# Patient Record
Sex: Female | Born: 1940 | Race: White | Hispanic: No | Marital: Married | Smoking: Current every day smoker
Health system: Southern US, Community
[De-identification: ages and names within clinical notes are randomized; demographics above are authoritative.]

## PROBLEM LIST (undated history)

## (undated) DIAGNOSIS — I10 Essential (primary) hypertension: Secondary | ICD-10-CM

## (undated) DIAGNOSIS — E7800 Pure hypercholesterolemia, unspecified: Secondary | ICD-10-CM

## (undated) DIAGNOSIS — M549 Dorsalgia, unspecified: Secondary | ICD-10-CM

## (undated) HISTORY — PX: ABDOMINAL HYSTERECTOMY: SHX81

## (undated) HISTORY — PX: TUMOR REMOVAL: SHX12

---

## 2004-10-19 ENCOUNTER — Ambulatory Visit: Payer: Self-pay | Admitting: Unknown Physician Specialty

## 2005-01-05 ENCOUNTER — Ambulatory Visit: Payer: Self-pay | Admitting: Internal Medicine

## 2006-01-08 ENCOUNTER — Ambulatory Visit: Payer: Self-pay | Admitting: Internal Medicine

## 2007-02-05 ENCOUNTER — Ambulatory Visit: Payer: Self-pay | Admitting: Internal Medicine

## 2008-02-06 ENCOUNTER — Ambulatory Visit: Payer: Self-pay | Admitting: Internal Medicine

## 2009-02-11 ENCOUNTER — Ambulatory Visit: Payer: Self-pay | Admitting: Internal Medicine

## 2010-04-08 ENCOUNTER — Ambulatory Visit: Payer: Self-pay | Admitting: Internal Medicine

## 2011-04-11 ENCOUNTER — Ambulatory Visit: Payer: Self-pay | Admitting: Internal Medicine

## 2012-03-15 ENCOUNTER — Ambulatory Visit: Payer: Self-pay | Admitting: Internal Medicine

## 2012-05-17 ENCOUNTER — Ambulatory Visit: Payer: Self-pay | Admitting: Internal Medicine

## 2012-10-08 ENCOUNTER — Ambulatory Visit: Payer: Self-pay | Admitting: Internal Medicine

## 2012-10-17 ENCOUNTER — Ambulatory Visit: Payer: Self-pay | Admitting: Internal Medicine

## 2013-02-20 ENCOUNTER — Ambulatory Visit: Payer: Self-pay | Admitting: Internal Medicine

## 2013-05-20 ENCOUNTER — Ambulatory Visit: Payer: Self-pay | Admitting: Internal Medicine

## 2013-07-03 ENCOUNTER — Ambulatory Visit: Payer: Self-pay | Admitting: Internal Medicine

## 2014-05-26 ENCOUNTER — Ambulatory Visit
Admit: 2014-05-26 | Disposition: A | Discharge status: Home / Self Care | Payer: Self-pay | Attending: Internal Medicine | Admitting: Internal Medicine

## 2014-08-05 ENCOUNTER — Ambulatory Visit
Admission: RE | Admit: 2014-08-05 | Discharge: 2014-08-05 | Disposition: A | Discharge status: Home / Self Care | Payer: Medicare Other | Source: Ambulatory Visit | Attending: Internal Medicine | Admitting: Internal Medicine

## 2014-08-05 ENCOUNTER — Other Ambulatory Visit: Payer: Self-pay | Admitting: Internal Medicine

## 2014-08-05 DIAGNOSIS — M549 Dorsalgia, unspecified: Secondary | ICD-10-CM | POA: Diagnosis not present

## 2016-05-31 ENCOUNTER — Ambulatory Visit: Payer: Medicare Other

## 2016-05-31 ENCOUNTER — Other Ambulatory Visit: Payer: Self-pay | Admitting: Unknown Physician Specialty

## 2016-05-31 DIAGNOSIS — R911 Solitary pulmonary nodule: Secondary | ICD-10-CM

## 2016-06-02 ENCOUNTER — Telehealth: Payer: Self-pay | Admitting: *Deleted

## 2016-06-02 DIAGNOSIS — Z87891 Personal history of nicotine dependence: Secondary | ICD-10-CM

## 2016-06-02 NOTE — Telephone Encounter (Signed)
Received referral for initial lung cancer screening scan. Contacted patient and obtained smoking history,(current, 37.5 pack year) as well as answering questions related to screening process. Patient denies signs of lung cancer such as weight loss or hemoptysis. Patient denies comorbidity that would prevent curative treatment if lung cancer were found. Patient is tentatively scheduled for shared decision making visit and CT scan on 06/20/16, pending insurance approval from business office.

## 2016-06-20 ENCOUNTER — Inpatient Hospital Stay: Payer: Medicare Other | Attending: Oncology | Admitting: Oncology

## 2016-06-20 ENCOUNTER — Ambulatory Visit
Admission: RE | Admit: 2016-06-20 | Discharge: 2016-06-20 | Disposition: A | Discharge status: Home / Self Care | Payer: Medicare Other | Source: Ambulatory Visit | Attending: Oncology | Admitting: Oncology

## 2016-06-20 ENCOUNTER — Encounter: Payer: Self-pay | Admitting: Oncology

## 2016-06-20 DIAGNOSIS — I2510 Atherosclerotic heart disease of native coronary artery without angina pectoris: Secondary | ICD-10-CM | POA: Diagnosis not present

## 2016-06-20 DIAGNOSIS — Z87891 Personal history of nicotine dependence: Secondary | ICD-10-CM | POA: Insufficient documentation

## 2016-06-20 DIAGNOSIS — Z122 Encounter for screening for malignant neoplasm of respiratory organs: Secondary | ICD-10-CM

## 2016-06-20 DIAGNOSIS — I700 Atherosclerosis of aorta: Secondary | ICD-10-CM | POA: Insufficient documentation

## 2016-06-20 DIAGNOSIS — K760 Fatty (change of) liver, not elsewhere classified: Secondary | ICD-10-CM | POA: Diagnosis not present

## 2016-06-20 DIAGNOSIS — F17210 Nicotine dependence, cigarettes, uncomplicated: Secondary | ICD-10-CM

## 2016-06-20 NOTE — Progress Notes (Signed)
In accordance with CMS guidelines, patient has met eligibility criteria including age, absence of signs or symptoms of lung cancer.  Social History  Substance Use Topics  . Smoking status: Current Every Day Smoker    Packs/day: 0.75    Years: 50.00  . Smokeless tobacco: Not on file  . Alcohol use Not on file     A shared decision-making session was conducted prior to the performance of CT scan. This includes one or more decision aids, includes benefits and harms of screening, follow-up diagnostic testing, over-diagnosis, false positive rate, and total radiation exposure.  Counseling on the importance of adherence to annual lung cancer LDCT screening, impact of co-morbidities, and ability or willingness to undergo diagnosis and treatment is imperative for compliance of the program.  Counseling on the importance of continued smoking cessation for former smokers; the importance of smoking cessation for current smokers, and information about tobacco cessation interventions have been given to patient including [place] and [facility] quit [place] programs.  Written order for lung cancer screening with LDCT has been given to the patient and any and all questions have been answered to the best of my abilities.   Yearly follow up will be coordinated by Burgess Estelle, Thoracic Navigator.

## 2016-06-22 ENCOUNTER — Encounter: Payer: Self-pay | Admitting: *Deleted

## 2017-04-08 ENCOUNTER — Other Ambulatory Visit: Payer: Self-pay

## 2017-04-08 ENCOUNTER — Ambulatory Visit
Admission: EM | Admit: 2017-04-08 | Discharge: 2017-04-08 | Disposition: A | Discharge status: Home / Self Care | Payer: Medicare Other | Attending: Family Medicine | Admitting: Family Medicine

## 2017-04-08 DIAGNOSIS — M549 Dorsalgia, unspecified: Secondary | ICD-10-CM

## 2017-04-08 DIAGNOSIS — G8929 Other chronic pain: Secondary | ICD-10-CM

## 2017-04-08 HISTORY — DX: Pure hypercholesterolemia, unspecified: E78.00

## 2017-04-08 HISTORY — DX: Dorsalgia, unspecified: M54.9

## 2017-04-08 HISTORY — DX: Essential (primary) hypertension: I10

## 2017-04-08 MED ORDER — OXYCODONE-ACETAMINOPHEN 5-325 MG PO TABS: ORAL_TABLET | ORAL | 0 refills | Status: DC

## 2017-04-08 NOTE — Discharge Instructions (Signed)
Follow up with Primary Care Provider tomorrow

## 2017-04-08 NOTE — ED Provider Notes (Signed)
[facility]    CSN: [id number] Arrival date & time: 04/08/17  0945     History   Chief Complaint Chief Complaint  Patient presents with  . Back Pain    HPI Shelley Thomas is a 77 y.o. female.   The history is provided by the patient.  Back Pain  Location:  Lumbar spine Quality:  Aching Pain severity:  Severe Pain is:  Same all the time Duration:  1 week Timing:  Constant Progression:  Unchanged Chronicity:  Chronic Context: not emotional stress, not falling, not jumping from heights, not lifting heavy objects, not MCA, not MVA, not occupational injury, not pedestrian accident, not physical stress, not recent illness, not recent injury and not twisting   Ineffective treatments:  Narcotics and OTC medications Associated symptoms: no abdominal pain, no abdominal swelling, no bladder incontinence, no bowel incontinence, no chest pain, no dysuria, no fever, no headaches, no leg pain, no numbness, no paresthesias, no pelvic pain, no perianal numbness, no tingling, no weakness and no weight loss     Past Medical History:  Diagnosis Date  . Back pain   . High cholesterol   . Hypertension     Patient Active Problem List   Diagnosis Date Noted  . Personal history of tobacco use, presenting hazards to health 06/20/2016    Past Surgical History:  Procedure Laterality Date  . ABDOMINAL HYSTERECTOMY    . TUMOR REMOVAL      OB History    No data available       Home Medications    Prior to Admission medications   Medication Sig Start Date End Date Taking? Authorizing Provider  fenofibrate (TRICOR) 145 MG tablet Take 145 mg by mouth daily.   Yes [provider]  traMADol (ULTRAM) 50 MG tablet Take by mouth every 6 (six) hours as needed.   Yes [provider]  oxyCODONE-acetaminophen (PERCOCET/ROXICET) 5-325 MG tablet 1-2 tabs po q 8 hours prn 04/08/17   Payton Mccallum, MD    Family History History reviewed. No pertinent family  history.  Social History Social History   Tobacco Use  . Smoking status: Current Every Day Smoker    Packs/day: 0.50    Years: 50.00    Pack years: 25.00    Types: Cigarettes  . Smokeless tobacco: Never Used  Substance Use Topics  . Alcohol use: No    Frequency: Never  . Drug use: No     Allergies   Patient has no known allergies.   Review of Systems Review of Systems  Constitutional: Negative for fever and weight loss.  Cardiovascular: Negative for chest pain.  Gastrointestinal: Negative for abdominal pain and bowel incontinence.  Genitourinary: Negative for bladder incontinence, dysuria and pelvic pain.  Musculoskeletal: Positive for back pain.  Neurological: Negative for tingling, weakness, numbness, headaches and paresthesias.     Physical Exam Triage Vital Signs ED Triage Vitals  Enc Vitals Group     BP 04/08/17 1100 (!) 163/96     Pulse Rate 04/08/17 1100 93     Resp 04/08/17 1100 20     Temp 04/08/17 1100 98.1 F (36.7 C)     Temp Source 04/08/17 1100 Oral     SpO2 04/08/17 1100 95 %     Weight 04/08/17 1101 126 lb (57.2 kg)     Height 04/08/17 1101 5\' 2"  (1.575 m)     Head Circumference --      Peak Flow --  Pain Score 04/08/17 1100 8     Pain Loc --      Pain Edu? --      Excl. in GC? --    No data found.  Updated Vital Signs BP (!) 163/96 (BP Location: Left Arm)   Pulse 93   Temp 98.1 F (36.7 C) (Oral)   Resp 20   Ht 5\' 2"  (1.575 m)   Wt 126 lb (57.2 kg)   SpO2 95%   BMI 23.05 kg/m   Visual Acuity Right Eye Distance:   Left Eye Distance:   Bilateral Distance:    Right Eye Near:   Left Eye Near:    Bilateral Near:     Physical Exam  Constitutional: She appears well-developed and well-nourished. No distress.  Musculoskeletal: She exhibits tenderness. She exhibits no edema.       Lumbar back: She exhibits tenderness and spasm. She exhibits normal range of motion, no bony tenderness, no swelling, no edema, no deformity, no  laceration, no pain and normal pulse.  Neurological: She is alert. She has normal reflexes. She displays normal reflexes. She exhibits normal muscle tone.  Skin: Skin is warm and dry. No rash noted. She is not diaphoretic. No erythema.  Nursing note and vitals reviewed.    UC Treatments / Results  Labs (all labs ordered are listed, but only abnormal results are displayed) Labs Reviewed - No data to display  EKG  EKG Interpretation None       Radiology No results found.  Procedures Procedures (including critical care time)  Medications Ordered in UC Medications - No data to display   Initial Impression / Assessment and Plan / UC Course  I have reviewed the triage vital signs and the nursing notes.  Pertinent labs & imaging results that were available during my care of the patient were reviewed by me and considered in my medical decision making (see chart for details).      Final Clinical Impressions(s) / UC Diagnoses   Final diagnoses:  Chronic right-sided back pain, unspecified back location    ED Discharge Orders        Ordered    oxyCODONE-acetaminophen (PERCOCET/ROXICET) 5-325 MG tablet     04/08/17 1203     1.  diagnosis reviewed with patient 2. rx as per orders above; reviewed possible side effects, interactions, risks and benefits  3. Recommend supportive treatment with rest, ice 4. Follow-up with PCP  Controlled Substance Prescriptions [place] Controlled Substance Registry consulted? Not Applicable   Payton Mccallumonty, Leary Mcnulty, MD 04/08/17 1215

## 2017-04-08 NOTE — ED Triage Notes (Addendum)
Pt with back pain. Seen by her Dr. This week for same. Given Tramadol by him and "It's not doing an good." Pain is chronic from an old "rumor removal". Left mid back pain. Pt would like oxy

## 2017-04-10 ENCOUNTER — Ambulatory Visit
Admission: RE | Admit: 2017-04-10 | Discharge: 2017-04-10 | Disposition: A | Discharge status: Home / Self Care | Payer: Medicare Other | Source: Ambulatory Visit | Attending: Internal Medicine | Admitting: Internal Medicine

## 2017-04-10 ENCOUNTER — Other Ambulatory Visit: Payer: Self-pay | Admitting: Internal Medicine

## 2017-04-10 DIAGNOSIS — M549 Dorsalgia, unspecified: Secondary | ICD-10-CM | POA: Insufficient documentation

## 2017-04-10 DIAGNOSIS — M47816 Spondylosis without myelopathy or radiculopathy, lumbar region: Secondary | ICD-10-CM | POA: Diagnosis not present

## 2017-04-10 DIAGNOSIS — M519 Unspecified thoracic, thoracolumbar and lumbosacral intervertebral disc disorder: Secondary | ICD-10-CM | POA: Insufficient documentation

## 2017-04-13 ENCOUNTER — Other Ambulatory Visit: Payer: Self-pay | Admitting: Internal Medicine

## 2017-04-13 DIAGNOSIS — R3121 Asymptomatic microscopic hematuria: Secondary | ICD-10-CM

## 2017-04-18 ENCOUNTER — Ambulatory Visit
Admission: RE | Admit: 2017-04-18 | Discharge: 2017-04-18 | Disposition: A | Discharge status: Home / Self Care | Payer: Medicare Other | Source: Ambulatory Visit | Attending: Internal Medicine | Admitting: Internal Medicine

## 2017-04-18 DIAGNOSIS — R109 Unspecified abdominal pain: Secondary | ICD-10-CM | POA: Diagnosis present

## 2017-04-18 DIAGNOSIS — N281 Cyst of kidney, acquired: Secondary | ICD-10-CM | POA: Insufficient documentation

## 2017-04-18 DIAGNOSIS — N200 Calculus of kidney: Secondary | ICD-10-CM | POA: Diagnosis not present

## 2017-04-18 DIAGNOSIS — N8110 Cystocele, unspecified: Secondary | ICD-10-CM | POA: Insufficient documentation

## 2017-04-18 DIAGNOSIS — R3121 Asymptomatic microscopic hematuria: Secondary | ICD-10-CM

## 2017-04-18 DIAGNOSIS — I700 Atherosclerosis of aorta: Secondary | ICD-10-CM | POA: Insufficient documentation

## 2017-05-16 ENCOUNTER — Other Ambulatory Visit: Payer: Self-pay | Admitting: Internal Medicine

## 2017-05-16 ENCOUNTER — Ambulatory Visit
Admission: RE | Admit: 2017-05-16 | Discharge: 2017-05-16 | Disposition: A | Discharge status: Home / Self Care | Payer: Medicare Other | Source: Ambulatory Visit | Attending: Internal Medicine | Admitting: Internal Medicine

## 2017-05-16 DIAGNOSIS — I700 Atherosclerosis of aorta: Secondary | ICD-10-CM | POA: Insufficient documentation

## 2017-05-16 DIAGNOSIS — R05 Cough: Secondary | ICD-10-CM | POA: Insufficient documentation

## 2017-05-16 DIAGNOSIS — R0902 Hypoxemia: Secondary | ICD-10-CM | POA: Diagnosis not present

## 2017-05-16 DIAGNOSIS — J440 Chronic obstructive pulmonary disease with acute lower respiratory infection: Secondary | ICD-10-CM | POA: Diagnosis not present

## 2017-05-16 DIAGNOSIS — J181 Lobar pneumonia, unspecified organism: Secondary | ICD-10-CM | POA: Insufficient documentation

## 2017-05-16 DIAGNOSIS — R059 Cough, unspecified: Secondary | ICD-10-CM

## 2017-05-16 DIAGNOSIS — R509 Fever, unspecified: Secondary | ICD-10-CM | POA: Diagnosis not present

## 2017-06-12 ENCOUNTER — Ambulatory Visit
Admission: RE | Admit: 2017-06-12 | Discharge: 2017-06-12 | Disposition: A | Discharge status: Home / Self Care | Payer: Medicare Other | Source: Ambulatory Visit | Attending: Internal Medicine | Admitting: Internal Medicine

## 2017-06-12 ENCOUNTER — Other Ambulatory Visit: Payer: Self-pay | Admitting: Internal Medicine

## 2017-06-12 DIAGNOSIS — Z8701 Personal history of pneumonia (recurrent): Secondary | ICD-10-CM | POA: Diagnosis not present

## 2017-06-12 DIAGNOSIS — I700 Atherosclerosis of aorta: Secondary | ICD-10-CM | POA: Diagnosis not present

## 2017-06-12 DIAGNOSIS — Z09 Encounter for follow-up examination after completed treatment for conditions other than malignant neoplasm: Secondary | ICD-10-CM | POA: Diagnosis not present

## 2017-06-12 DIAGNOSIS — J440 Chronic obstructive pulmonary disease with acute lower respiratory infection: Secondary | ICD-10-CM | POA: Diagnosis not present

## 2017-06-12 DIAGNOSIS — J189 Pneumonia, unspecified organism: Secondary | ICD-10-CM

## 2017-06-18 ENCOUNTER — Telehealth: Payer: Self-pay | Admitting: *Deleted

## 2017-06-18 DIAGNOSIS — Z87891 Personal history of nicotine dependence: Secondary | ICD-10-CM

## 2017-06-18 DIAGNOSIS — Z122 Encounter for screening for malignant neoplasm of respiratory organs: Secondary | ICD-10-CM

## 2017-06-18 NOTE — Telephone Encounter (Signed)
Notified patient that annual lung cancer screening low dose CT scan is due currently or will be in near future. Confirmed that patient is within the age range of 55-77, and asymptomatic, (no signs or symptoms of lung cancer). Patient denies illness that would prevent curative treatment for lung cancer if found. Verified smoking history, (former, quit 04/06/17, 37.5 pack year). The shared decision making visit was done 06/20/16. Patient is agreeable for CT scan being scheduled.

## 2017-06-27 ENCOUNTER — Ambulatory Visit: Payer: Medicare Other

## 2017-06-29 ENCOUNTER — Ambulatory Visit
Admission: RE | Admit: 2017-06-29 | Discharge: 2017-06-29 | Disposition: A | Discharge status: Home / Self Care | Payer: Medicare Other | Source: Ambulatory Visit | Attending: Nurse Practitioner | Admitting: Nurse Practitioner

## 2017-06-29 DIAGNOSIS — I700 Atherosclerosis of aorta: Secondary | ICD-10-CM | POA: Diagnosis not present

## 2017-06-29 DIAGNOSIS — Z87891 Personal history of nicotine dependence: Secondary | ICD-10-CM | POA: Diagnosis not present

## 2017-06-29 DIAGNOSIS — Z122 Encounter for screening for malignant neoplasm of respiratory organs: Secondary | ICD-10-CM

## 2017-06-29 DIAGNOSIS — J439 Emphysema, unspecified: Secondary | ICD-10-CM | POA: Insufficient documentation

## 2017-07-06 ENCOUNTER — Telehealth: Payer: Self-pay | Admitting: *Deleted

## 2017-07-06 NOTE — Telephone Encounter (Signed)
Notified patient of LDCT lung cancer screening program results with recommendation for 3 month follow up imaging. Also notified of incidental findings noted below and is encouraged to discuss further with PCP who will receive a copy of this note and/or the CT report. Patient verbalizes understanding. Note, recent history of antibiotic treatment verified by PCP, reviewed with thoracic surgery and radiologist. Revised recommendation is for 3 month follow up CT without further antibiotic treatment.   IMPRESSION: 1. Lung-RADS 4Bs, suspicious. Additional imaging evaluation or consultation with Pulmonology or Thoracic Surgery recommended. 2. The S modifier refers to the potential inflammatory etiology of the new 13.9 mm nodule in the right base. For this reason consider treatment with antibiotic therapy and follow-up imaging in 3 months following resolution of associated respiratory symptoms the patient may have using the CT CHEST LCS FOLLOWUP protocol. 3.  Emphysema (ICD10-J43.9). 4.  Aortic Atherosclerosis (ICD10-I70.0).

## 2017-09-15 ENCOUNTER — Telehealth: Payer: Self-pay

## 2017-09-15 NOTE — Telephone Encounter (Signed)
Call pt on 09-15-17 regarding Lung Screening  Former smoker would like scan to be on Aug 26th morning if possible.

## 2017-09-17 ENCOUNTER — Telehealth: Payer: Self-pay | Admitting: *Deleted

## 2017-09-17 DIAGNOSIS — Z122 Encounter for screening for malignant neoplasm of respiratory organs: Secondary | ICD-10-CM

## 2017-09-17 DIAGNOSIS — R918 Other nonspecific abnormal finding of lung field: Secondary | ICD-10-CM

## 2017-09-17 DIAGNOSIS — Z87891 Personal history of nicotine dependence: Secondary | ICD-10-CM

## 2017-09-17 NOTE — Telephone Encounter (Signed)
Patient has been notified that her recommended lung screening follow up imaging is due soon. Patient is agreeable to having it scheduled

## 2017-10-01 ENCOUNTER — Ambulatory Visit
Admission: RE | Admit: 2017-10-01 | Discharge: 2017-10-01 | Disposition: A | Discharge status: Home / Self Care | Payer: Medicare Other | Source: Ambulatory Visit | Attending: Nurse Practitioner | Admitting: Nurse Practitioner

## 2017-10-01 DIAGNOSIS — N200 Calculus of kidney: Secondary | ICD-10-CM | POA: Insufficient documentation

## 2017-10-01 DIAGNOSIS — Z122 Encounter for screening for malignant neoplasm of respiratory organs: Secondary | ICD-10-CM | POA: Diagnosis present

## 2017-10-01 DIAGNOSIS — I700 Atherosclerosis of aorta: Secondary | ICD-10-CM | POA: Diagnosis not present

## 2017-10-01 DIAGNOSIS — Z87891 Personal history of nicotine dependence: Secondary | ICD-10-CM | POA: Diagnosis not present

## 2017-10-01 DIAGNOSIS — R918 Other nonspecific abnormal finding of lung field: Secondary | ICD-10-CM | POA: Insufficient documentation

## 2017-10-01 DIAGNOSIS — K760 Fatty (change of) liver, not elsewhere classified: Secondary | ICD-10-CM | POA: Insufficient documentation

## 2017-10-01 DIAGNOSIS — J479 Bronchiectasis, uncomplicated: Secondary | ICD-10-CM | POA: Diagnosis not present

## 2017-10-03 ENCOUNTER — Telehealth: Payer: Self-pay | Admitting: *Deleted

## 2017-10-03 NOTE — Telephone Encounter (Signed)
Notified patient of LDCT lung cancer screening program results with recommendation for 12 month follow up imaging. Also notified of incidental findings noted below and is encouraged to discuss further with PCP who will receive a copy of this note and/or the CT report. Patient verbalizes understanding.   IMPRESSION: 1. Lung-RADS 2, benign appearance or behavior. Continue annual screening with low-dose chest CT without contrast in 12 months. 2. Stable focal patchy tree-in-bud opacity/mild peribronchovascular consolidation in the basilar right upper lobe with associated mild bronchiectasis, most compatible with chronic mild bronchiolitis. 3. Diffuse hepatic steatosis. 4. Nonobstructing left nephrolithiasis.  Aortic Atherosclerosis (ICD10-I70.0).

## 2017-12-01 ENCOUNTER — Ambulatory Visit
Admission: EM | Admit: 2017-12-01 | Discharge: 2017-12-01 | Disposition: A | Discharge status: Home / Self Care | Payer: Medicare Other | Attending: Family Medicine | Admitting: Family Medicine

## 2017-12-01 ENCOUNTER — Other Ambulatory Visit: Payer: Self-pay

## 2017-12-01 ENCOUNTER — Encounter: Payer: Self-pay | Admitting: Gynecology

## 2017-12-01 DIAGNOSIS — R300 Dysuria: Secondary | ICD-10-CM

## 2017-12-01 DIAGNOSIS — I10 Essential (primary) hypertension: Secondary | ICD-10-CM | POA: Diagnosis not present

## 2017-12-01 DIAGNOSIS — R350 Frequency of micturition: Secondary | ICD-10-CM | POA: Diagnosis present

## 2017-12-01 DIAGNOSIS — B9689 Other specified bacterial agents as the cause of diseases classified elsewhere: Secondary | ICD-10-CM | POA: Diagnosis not present

## 2017-12-01 DIAGNOSIS — N3001 Acute cystitis with hematuria: Secondary | ICD-10-CM | POA: Insufficient documentation

## 2017-12-01 DIAGNOSIS — E7800 Pure hypercholesterolemia, unspecified: Secondary | ICD-10-CM | POA: Diagnosis not present

## 2017-12-01 DIAGNOSIS — F17210 Nicotine dependence, cigarettes, uncomplicated: Secondary | ICD-10-CM | POA: Diagnosis not present

## 2017-12-01 DIAGNOSIS — Z79899 Other long term (current) drug therapy: Secondary | ICD-10-CM | POA: Insufficient documentation

## 2017-12-01 LAB — URINALYSIS, COMPLETE (UACMP) WITH MICROSCOPIC
BILIRUBIN URINE: NEGATIVE
Glucose, UA: NEGATIVE mg/dL
Ketones, ur: NEGATIVE mg/dL
NITRITE: NEGATIVE
PH: 5 (ref 5.0–8.0)
Protein, ur: 100 mg/dL — AB
Specific Gravity, Urine: 1.025 (ref 1.005–1.030)

## 2017-12-01 MED ORDER — CEPHALEXIN 500 MG PO CAPS: 500.0000 mg | ORAL_CAPSULE | Freq: Two times a day (BID) | ORAL | 0 refills | Status: AC

## 2017-12-01 NOTE — ED Provider Notes (Signed)
[facility]    CSN: [id number] Arrival date & time: 12/01/17  1530     History   Chief Complaint No chief complaint on file.   HPI Shelley Thomas is a 77 y.o. female.   Presents to the urgent care facility for evaluation of urinary symptoms x2 days.  Patient states 2 days ago she developed increase in urinary frequency and pressure with urination.  Patient states when she urinates she feels burning.  She denies any back pain, fevers, nausea vomiting or abdominal pain.  Patient has seen some improvement today with her symptoms except for increase in urinary frequency.  She denies having any other symptoms or feeling bad.  No history of recent UTIs.  No vaginal discharge or bleeding.  HPI  Past Medical History:  Diagnosis Date  . Back pain   . High cholesterol   . Hypertension     Patient Active Problem List   Diagnosis Date Noted  . Personal history of tobacco use, presenting hazards to health 06/20/2016    Past Surgical History:  Procedure Laterality Date  . ABDOMINAL HYSTERECTOMY    . TUMOR REMOVAL      OB History   None      Home Medications    Prior to Admission medications   Medication Sig Start Date End Date Taking? Authorizing Provider  fenofibrate (TRICOR) 145 MG tablet Take 145 mg by mouth daily.   Yes [provider]  lisinopril (PRINIVIL,ZESTRIL) 20 MG tablet Take 20 mg by mouth 2 (two) times daily. 11/07/17  Yes [provider]  cephALEXin (KEFLEX) 500 MG capsule Take 1 capsule (500 mg total) by mouth 2 (two) times daily for 7 days. 12/01/17 12/08/17  Evon Slack, PA-C  oxyCODONE-acetaminophen (PERCOCET/ROXICET) 5-325 MG tablet 1-2 tabs po q 8 hours prn 04/08/17   Payton Mccallum, MD  traMADol (ULTRAM) 50 MG tablet Take by mouth every 6 (six) hours as needed.    [provider]    Family History History reviewed. No pertinent family history.  Social History Social History   Tobacco Use  . Smoking  status: Current Every Day Smoker    Packs/day: 0.50    Years: 50.00    Pack years: 25.00    Types: Cigarettes  . Smokeless tobacco: Never Used  Substance Use Topics  . Alcohol use: No    Frequency: Never  . Drug use: No     Allergies   Patient has no known allergies.   Review of Systems Review of Systems  Constitutional: Negative for fever.  Gastrointestinal: Negative for nausea and vomiting.  Genitourinary: Positive for dysuria, frequency and urgency. Negative for difficulty urinating, vaginal bleeding and vaginal discharge.  Musculoskeletal: Negative for back pain.  Skin: Negative for rash and wound.     Physical Exam Triage Vital Signs ED Triage Vitals  Enc Vitals Group     BP 12/01/17 1542 (!) 120/97     Pulse Rate 12/01/17 1542 (!) 105     Resp 12/01/17 1542 16     Temp 12/01/17 1542 98.6 F (37 C)     Temp Source 12/01/17 1542 Oral     SpO2 12/01/17 1542 96 %     Weight 12/01/17 1544 125 lb (56.7 kg)     Height 12/01/17 1544 5\' 2"  (1.575 m)     Head Circumference --      Peak Flow --      Pain Score 12/01/17 1543 0     Pain  Loc --      Pain Edu? --      Excl. in GC? --    No data found.  Updated Vital Signs BP (!) 120/97 (BP Location: Left Arm)   Pulse (!) 105   Temp 98.6 F (37 C) (Oral)   Resp 16   Ht 5\' 2"  (1.575 m)   Wt 125 lb (56.7 kg)   SpO2 96%   BMI 22.86 kg/m   Visual Acuity Right Eye Distance:   Left Eye Distance:   Bilateral Distance:    Right Eye Near:   Left Eye Near:    Bilateral Near:     Physical Exam  Constitutional: She is oriented to person, place, and time. She appears well-developed and well-nourished.  HENT:  Head: Normocephalic and atraumatic.  Eyes: Conjunctivae are normal.  Neck: Normal range of motion.  Cardiovascular: Normal rate.  Pulmonary/Chest: Effort normal. No respiratory distress.  Abdominal: Soft. She exhibits no distension. There is no tenderness. There is no guarding.  No CVA tenderness to  percussion bilaterally.  Musculoskeletal: Normal range of motion.  Neurological: She is alert and oriented to person, place, and time.  Skin: Skin is warm. No rash noted.  Psychiatric: She has a normal mood and affect. Her behavior is normal. Thought content normal.     UC Treatments / Results  Labs (all labs ordered are listed, but only abnormal results are displayed) Labs Reviewed  URINALYSIS, COMPLETE (UACMP) WITH MICROSCOPIC - Abnormal; Notable for the following components:      Result Value   APPearance CLOUDY (*)    Hgb urine dipstick MODERATE (*)    Protein, ur 100 (*)    Leukocytes, UA MODERATE (*)    Bacteria, UA FEW (*)    All other components within normal limits  URINE CULTURE    EKG None  Radiology No results found.  Procedures Procedures (including critical care time)  Medications Ordered in UC Medications - No data to display  Initial Impression / Assessment and Plan / UC Course  I have reviewed the triage vital signs and the nursing notes.  Pertinent labs & imaging results that were available during my care of the patient were reviewed by me and considered in my medical decision making (see chart for details).     77 year old female with acute UTI with hematuria.  She is started on cephalexin.  Urine culture obtained.  She understands signs and symptoms return to clinic for. Final Clinical Impressions(s) / UC Diagnoses   Final diagnoses:  Dysuria  Acute cystitis with hematuria     Discharge Instructions     Please make sure you continue to drink lots of fluids.  If any fevers back pain nausea or vomiting return to the clinic.  Make sure you take antibiotics as prescribed.    ED Prescriptions    Medication Sig Dispense Auth. Provider   cephALEXin (KEFLEX) 500 MG capsule Take 1 capsule (500 mg total) by mouth 2 (two) times daily for 7 days. 14 capsule Ronnette Juniper        Evon Slack, [place] 12/01/17 1308

## 2017-12-01 NOTE — Discharge Instructions (Addendum)
Please make sure you continue to drink lots of fluids.  If any fevers back pain nausea or vomiting return to the clinic.  Make sure you take antibiotics as prescribed.

## 2017-12-01 NOTE — ED Triage Notes (Signed)
Per patient with frequent urination x couple days.

## 2017-12-03 LAB — URINE CULTURE

## 2018-10-05 IMAGING — DX DG CHEST 2V
2 series · 2 of 2 positions shown · non-contrast
Comparison: Chest x-ray of July 03, 2013 and chest CT scan of June 20, 2016.

CLINICAL DATA: Productive cough and high fever for the past 3 days
associated with shortness of breath. Found to be hypoxic. Long-term
smoker.

EXAM:
CHEST - 2 VIEW

[chest pa]
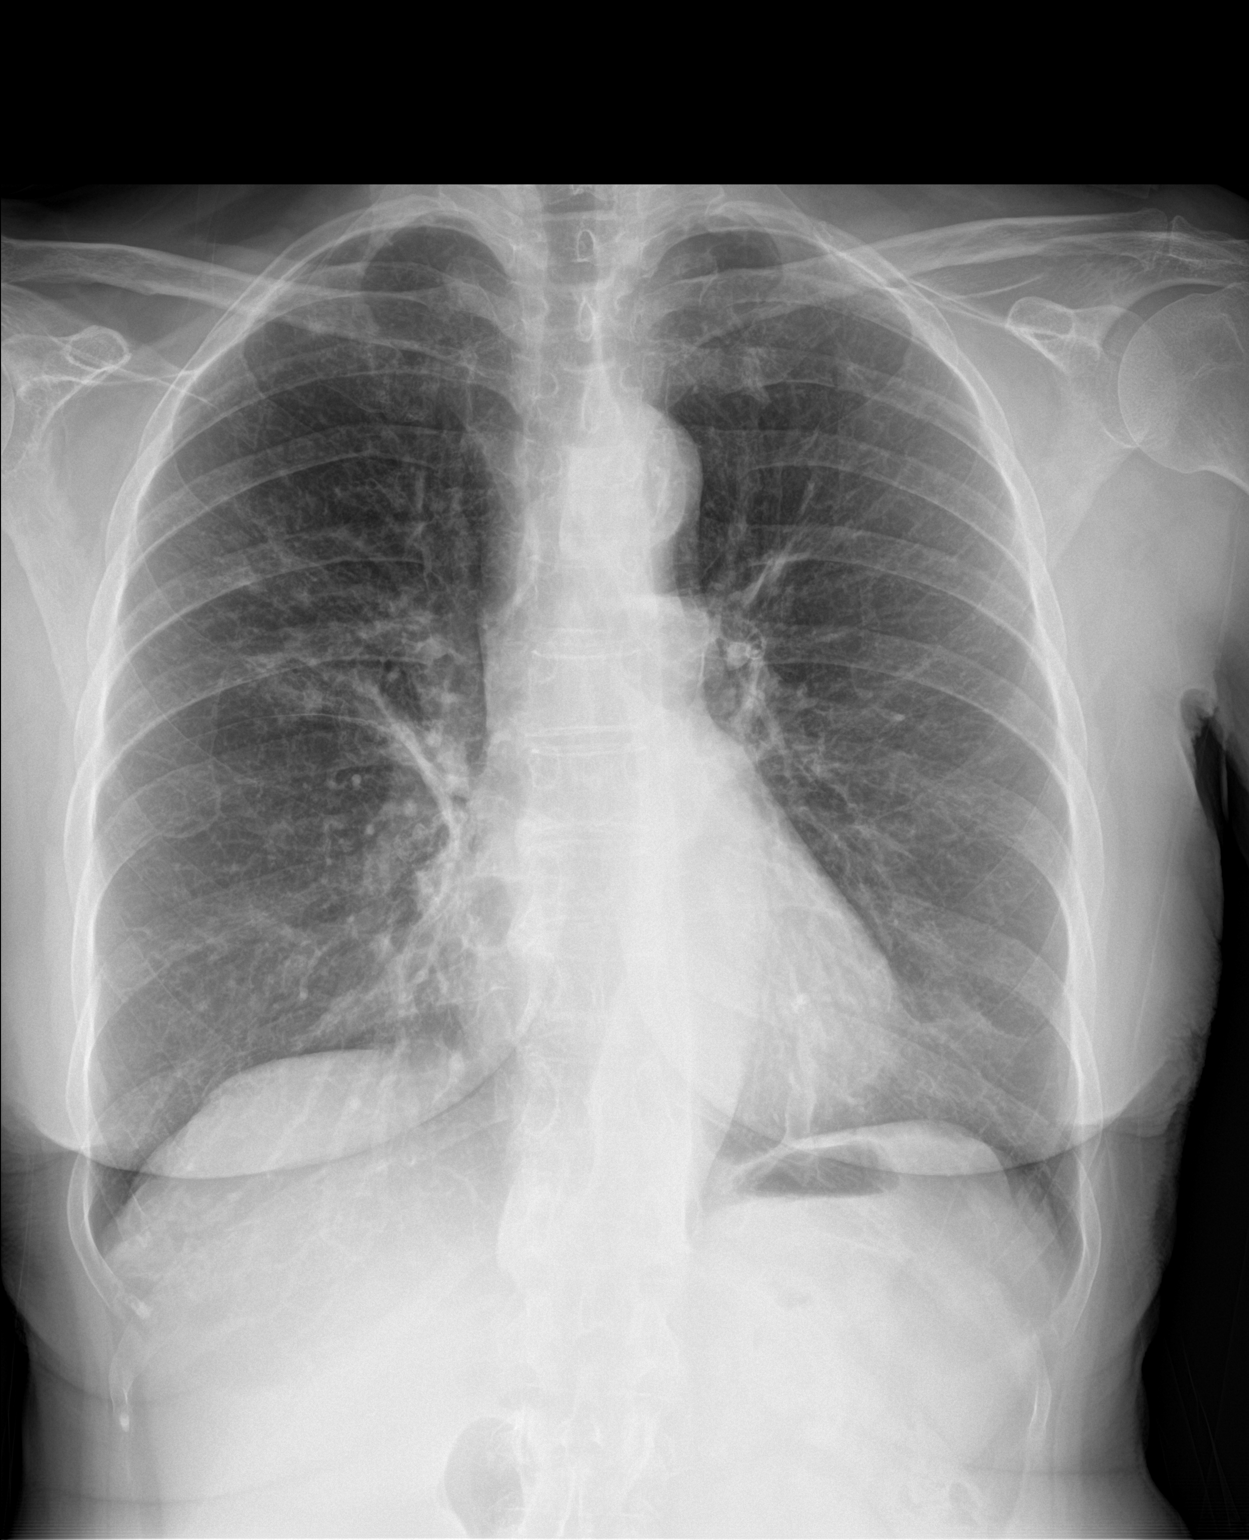

[chest lat]
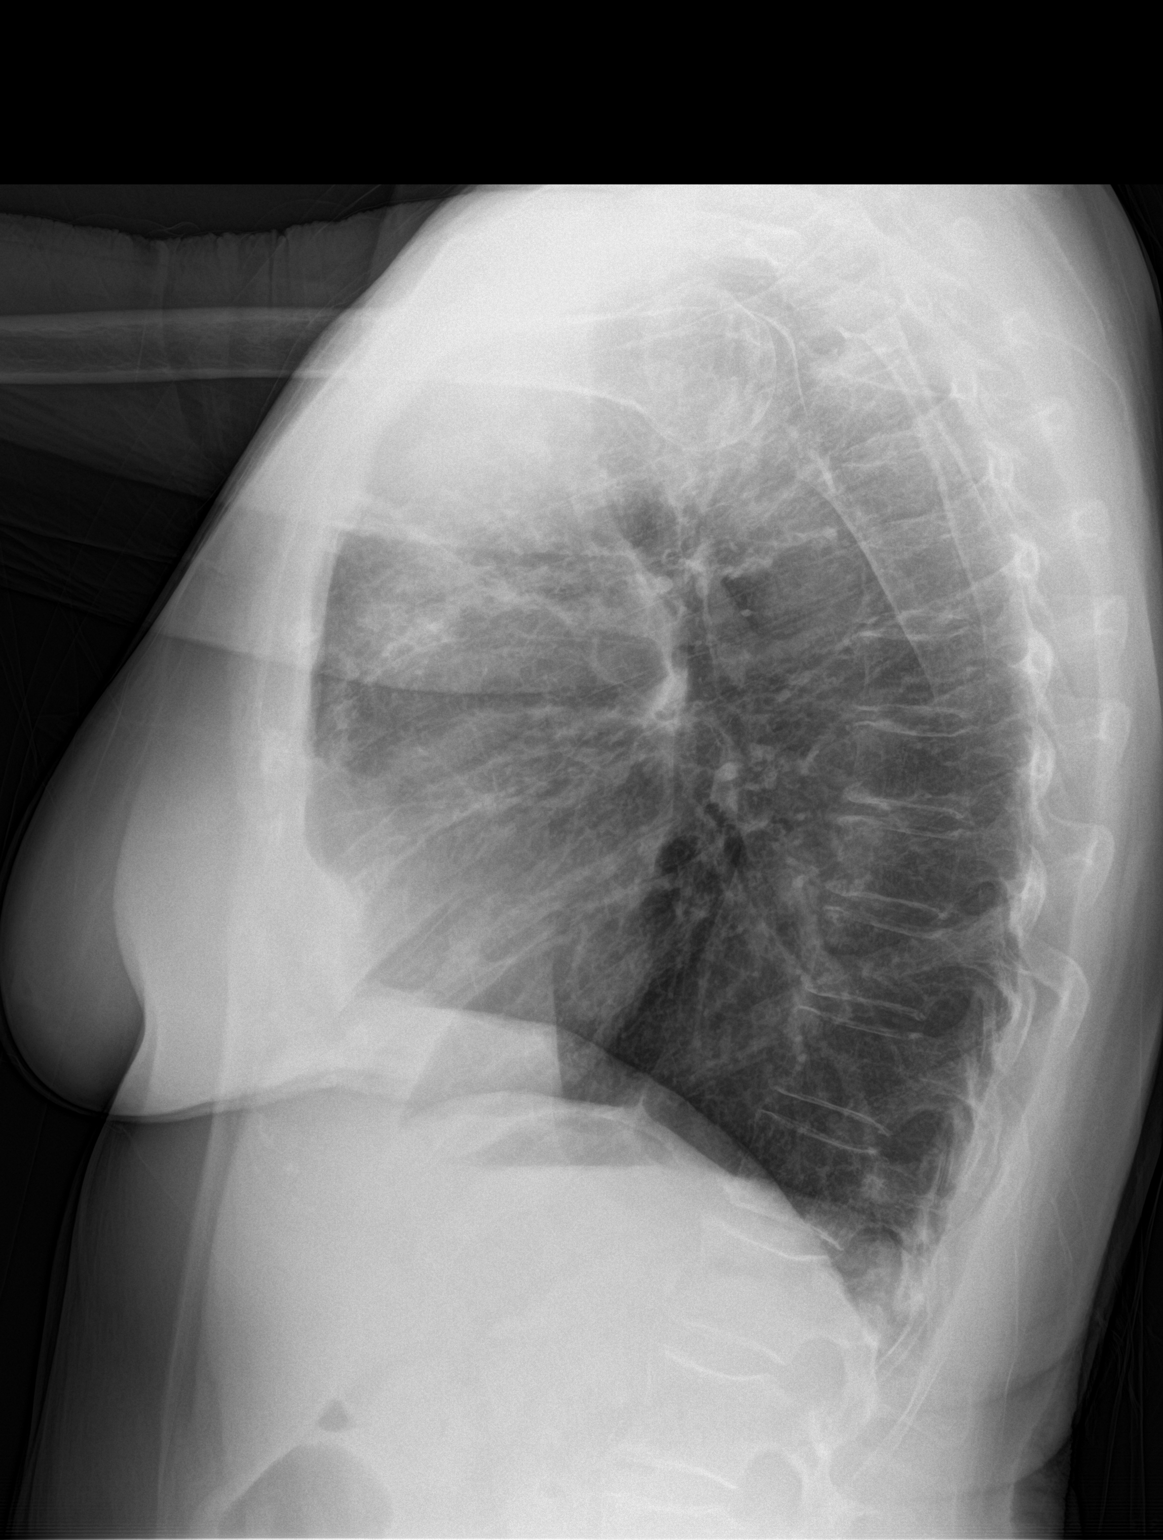

[2 of 2 positions shown; findings below may reference images not displayed]

FINDINGS: There is patchy airspace opacity in the anterior inferior aspect of
the right upper lobe. The right middle and lower lobes are clear.
The left lung is clear. There is no pleural effusion. The heart and
pulmonary vascularity are normal. There is calcification in the wall
of the aortic arch. The bony thorax exhibits no acute abnormality.
IMPRESSION: COPD. Superimposed right upper lobe pneumonia. Followup PA and
lateral chest X-ray is recommended in 3-4 weeks following trial of
antibiotic therapy to ensure resolution and exclude underlying
malignancy.

Thoracic aortic atherosclerosis.

## 2018-10-10 ENCOUNTER — Telehealth: Payer: Self-pay | Admitting: *Deleted

## 2018-10-10 DIAGNOSIS — Z122 Encounter for screening for malignant neoplasm of respiratory organs: Secondary | ICD-10-CM

## 2018-10-10 DIAGNOSIS — Z87891 Personal history of nicotine dependence: Secondary | ICD-10-CM

## 2018-10-10 NOTE — Telephone Encounter (Signed)
Patient has been notified that annual lung cancer screening low dose CT scan is due currently or will be in near future. Confirmed that patient is within the age range of 55-77, and asymptomatic, (no signs or symptoms of lung cancer). Patient denies illness that would prevent curative treatment for lung cancer if found. Verified smoking history, (current, 37.5 pack year). The shared decision making visit was done 06/20/16. Patient is agreeable for CT scan being scheduled.

## 2018-10-15 ENCOUNTER — Other Ambulatory Visit: Payer: Self-pay

## 2018-10-15 ENCOUNTER — Ambulatory Visit
Admission: RE | Admit: 2018-10-15 | Discharge: 2018-10-15 | Disposition: A | Discharge status: Home / Self Care | Payer: Medicare Other | Source: Ambulatory Visit | Attending: Oncology | Admitting: Oncology

## 2018-10-15 DIAGNOSIS — Z122 Encounter for screening for malignant neoplasm of respiratory organs: Secondary | ICD-10-CM | POA: Diagnosis present

## 2018-10-15 DIAGNOSIS — Z87891 Personal history of nicotine dependence: Secondary | ICD-10-CM

## 2018-10-18 ENCOUNTER — Telehealth: Payer: Self-pay | Admitting: *Deleted

## 2018-10-18 NOTE — Telephone Encounter (Signed)
Notified patient of LDCT lung cancer screening program results with recommendation for 6 month follow up imaging. Also notified of incidental findings noted below and is encouraged to discuss further with PCP who will receive a copy of this note and/or the CT report. Patient verbalizes understanding.   IMPRESSION: 1. Lung-RADS 3, probably benign findings. Short-term follow-up in 6 months is recommended with repeat low-dose chest CT without contrast (please use the following order, "CT CHEST LCS NODULE FOLLOW-UP W/O CM"). 2. Small hiatal hernia. 3. Nonobstructing left nephrolithiasis.  Aortic Atherosclerosis (ICD10-I70.0) and Emphysema (ICD10-J43.9).

## 2018-11-01 IMAGING — DX DG CHEST 2V
2 series · 2 of 2 positions shown · non-contrast
Comparison: Chest x-ray of May 16, 2017

CLINICAL DATA: Follow-up of pneumonia. Persistent nonproductive
cough.

EXAM:
CHEST - 2 VIEW

[chest lat]
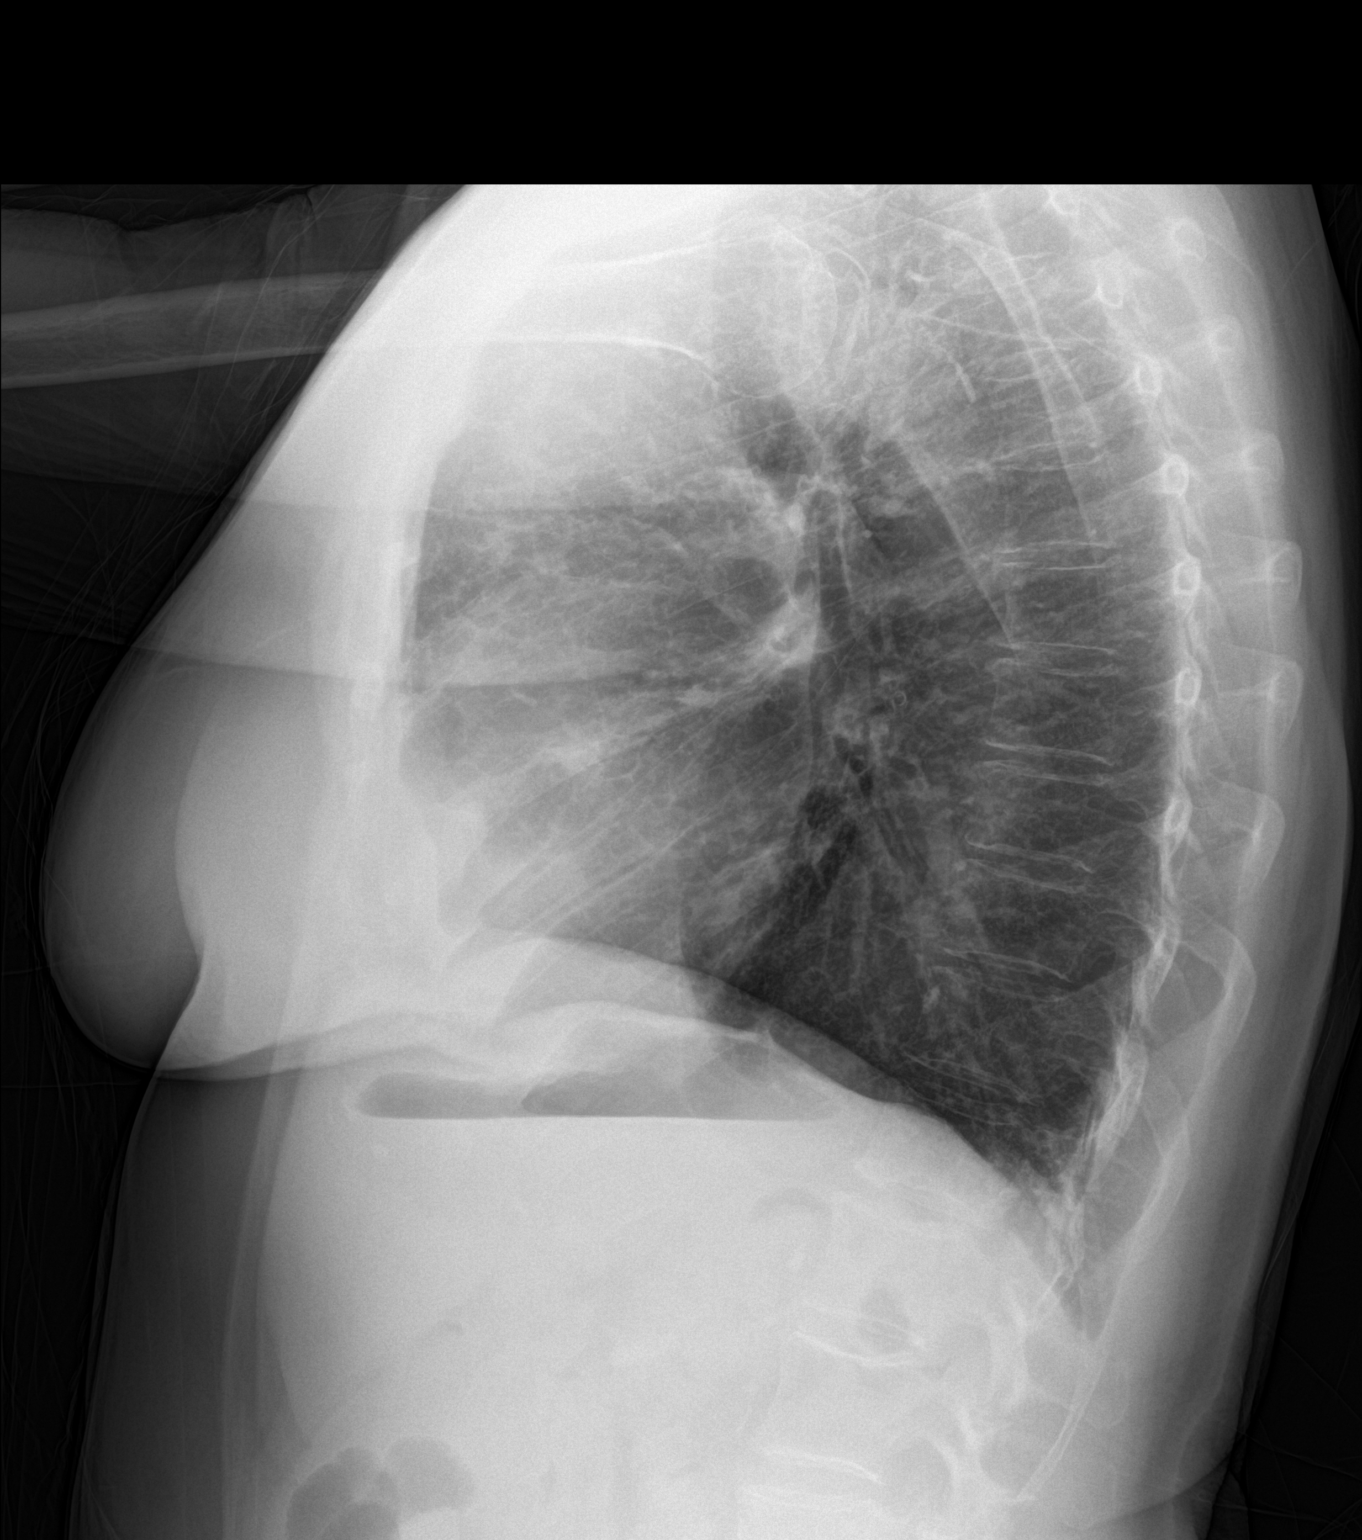

[chest pa]
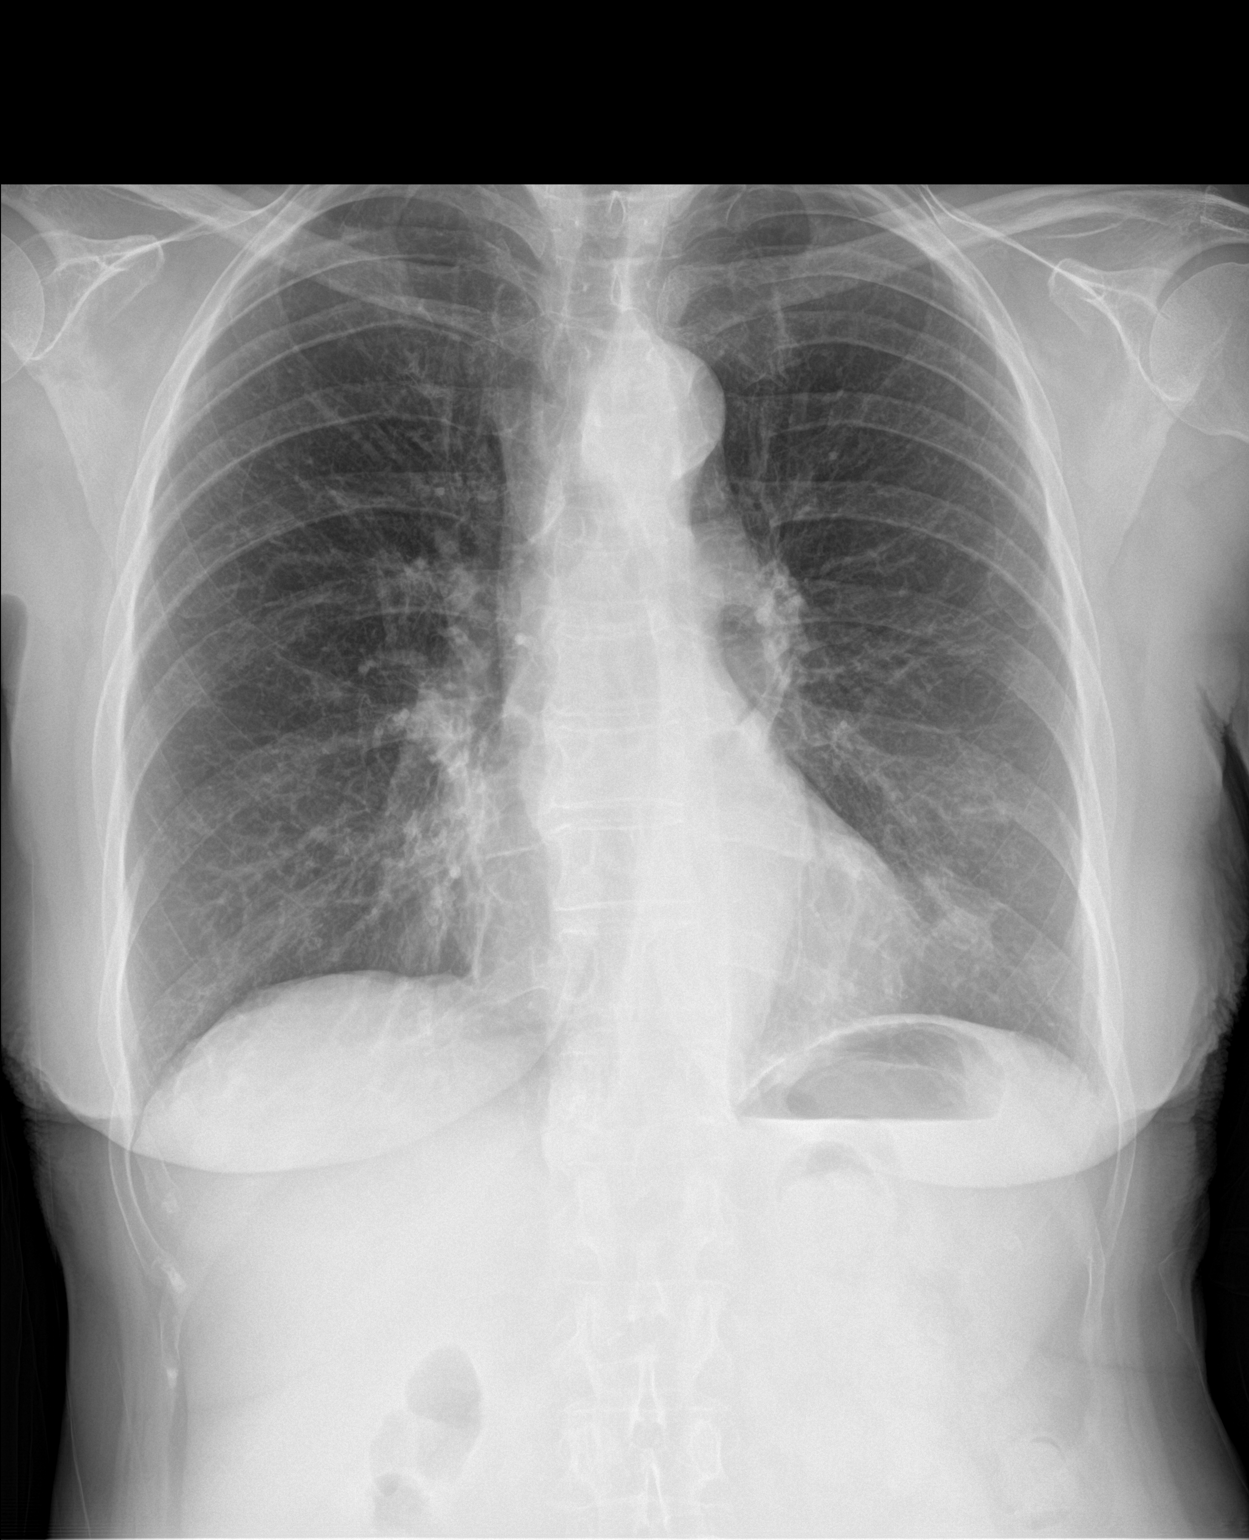

[2 of 2 positions shown; findings below may reference images not displayed]

FINDINGS: The lungs are well-expanded. Patchy interstitial infiltrate in the
right perihilar region has cleared. On the left there is subtle
density that projects lateral to the cardiac apex in the lingula
which is new and may reflect acute pneumonia. The heart and
pulmonary vascularity are normal. There is calcification in the wall
of the aortic arch. The bony thorax exhibits no acute abnormality.
IMPRESSION: Subsegmental atelectasis or infiltrate has developed in the lingula.
Followup PA and lateral chest X-ray is recommended in 3-4 weeks
following trial of antibiotic therapy to ensure resolution and
exclude underlying malignancy.

Previously demonstrated right perihilar pneumonia has cleared.
Underlying COPD.

Thoracic aortic atherosclerosis.

## 2019-04-16 ENCOUNTER — Telehealth: Payer: Self-pay | Admitting: *Deleted

## 2019-04-16 DIAGNOSIS — Z87891 Personal history of nicotine dependence: Secondary | ICD-10-CM

## 2019-04-16 DIAGNOSIS — R918 Other nonspecific abnormal finding of lung field: Secondary | ICD-10-CM

## 2019-04-16 NOTE — Telephone Encounter (Signed)
Contacted and scheduled for LCS nodule follow up scan 

## 2019-04-22 ENCOUNTER — Ambulatory Visit
Admission: RE | Admit: 2019-04-22 | Discharge: 2019-04-22 | Disposition: A | Discharge status: Home / Self Care | Payer: Medicare Other | Source: Ambulatory Visit | Attending: Oncology | Admitting: Oncology

## 2019-04-22 ENCOUNTER — Other Ambulatory Visit: Payer: Self-pay

## 2019-04-22 DIAGNOSIS — Z87891 Personal history of nicotine dependence: Secondary | ICD-10-CM | POA: Insufficient documentation

## 2019-04-22 DIAGNOSIS — R918 Other nonspecific abnormal finding of lung field: Secondary | ICD-10-CM | POA: Diagnosis present

## 2019-04-24 ENCOUNTER — Encounter: Payer: Self-pay | Admitting: *Deleted

## 2020-04-08 ENCOUNTER — Telehealth: Payer: Self-pay | Admitting: *Deleted

## 2020-04-08 NOTE — Telephone Encounter (Signed)
Scheduled 05/20/2020 8:00 AM, no change in insurance, reports smoking 5 cigarettes a day.

## 2020-04-12 ENCOUNTER — Other Ambulatory Visit: Payer: Self-pay | Admitting: *Deleted

## 2020-04-12 DIAGNOSIS — Z87891 Personal history of nicotine dependence: Secondary | ICD-10-CM

## 2020-04-12 DIAGNOSIS — F17200 Nicotine dependence, unspecified, uncomplicated: Secondary | ICD-10-CM

## 2020-04-12 DIAGNOSIS — Z122 Encounter for screening for malignant neoplasm of respiratory organs: Secondary | ICD-10-CM

## 2020-04-12 NOTE — Progress Notes (Signed)
Contacted and scheduled for annual lung screening scan. Patient is a current smoker with a 37.75 pack year history. 

## 2020-05-20 ENCOUNTER — Other Ambulatory Visit: Payer: Self-pay

## 2020-05-20 ENCOUNTER — Ambulatory Visit
Admission: RE | Admit: 2020-05-20 | Discharge: 2020-05-20 | Disposition: A | Discharge status: Home / Self Care | Payer: Medicare Other | Source: Ambulatory Visit | Attending: Nurse Practitioner | Admitting: Nurse Practitioner

## 2020-05-20 DIAGNOSIS — F17210 Nicotine dependence, cigarettes, uncomplicated: Secondary | ICD-10-CM | POA: Diagnosis not present

## 2020-05-20 DIAGNOSIS — Z87891 Personal history of nicotine dependence: Secondary | ICD-10-CM

## 2020-05-20 DIAGNOSIS — I2510 Atherosclerotic heart disease of native coronary artery without angina pectoris: Secondary | ICD-10-CM | POA: Insufficient documentation

## 2020-05-20 DIAGNOSIS — Z122 Encounter for screening for malignant neoplasm of respiratory organs: Secondary | ICD-10-CM | POA: Insufficient documentation

## 2020-05-20 DIAGNOSIS — J439 Emphysema, unspecified: Secondary | ICD-10-CM | POA: Diagnosis not present

## 2020-05-20 DIAGNOSIS — F17200 Nicotine dependence, unspecified, uncomplicated: Secondary | ICD-10-CM

## 2020-05-20 DIAGNOSIS — N200 Calculus of kidney: Secondary | ICD-10-CM | POA: Insufficient documentation

## 2020-05-26 ENCOUNTER — Encounter: Payer: Self-pay | Admitting: *Deleted

## 2020-09-10 IMAGING — CT CT CHEST LCS NODULE FOLLOW-UP W/O CM
2 of 5 series · 15 of 40 positions shown, 18 images · non-contrast
Comparison: 10/15/2018 and 10/01/2017.

CLINICAL DATA: Current smoker, 38 pack-year history.

EXAM:
CT CHEST WITHOUT CONTRAST FOR LUNG CANCER SCREENING NODULE FOLLOW-UP
TECHNIQUE: Multidetector CT imaging of the chest was performed following the
standard protocol without IV contrast.

[Series 3: lung lcs f/u 1.00 · axial · 0.58mm/px · z∈[-1236,-929]mm · 12 of 341 slices shown, 15 images]
[im 17/341  mediastinal]
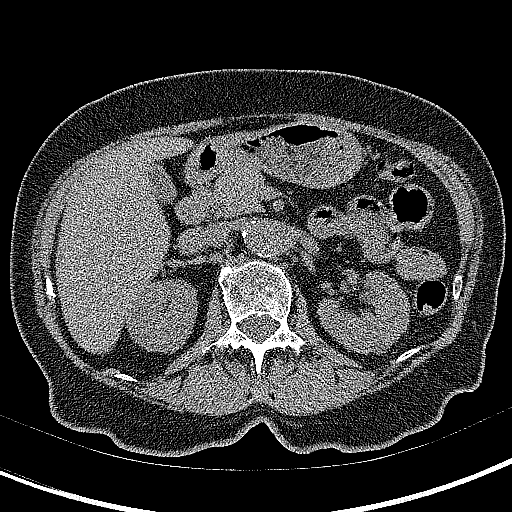
[im 17/341  lung]
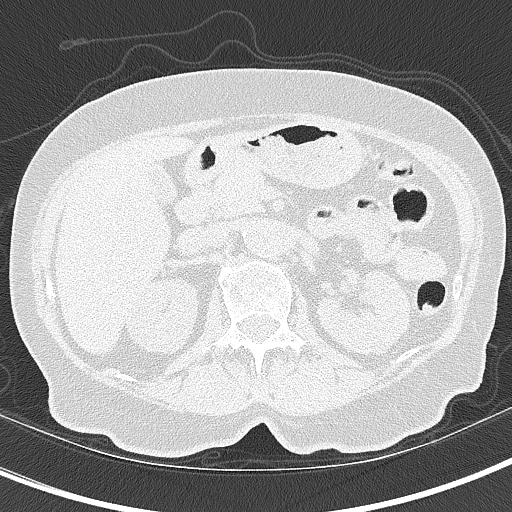
[im 49/341  lung]
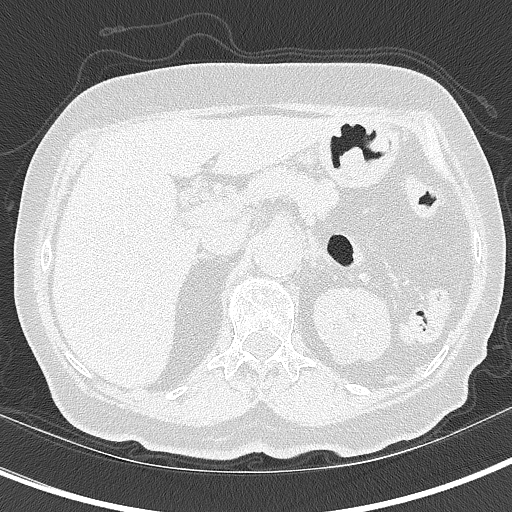
[im 81/341  lung]
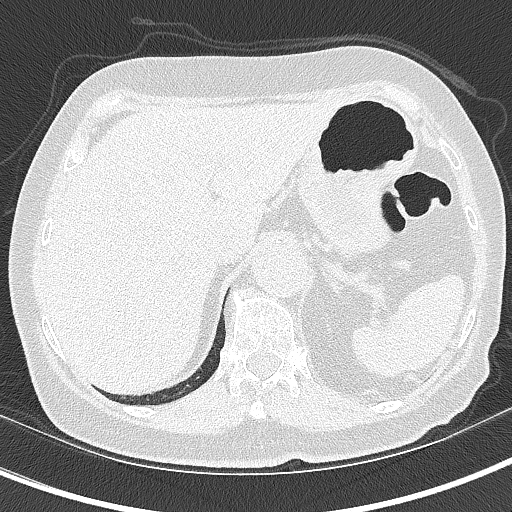
[im 98/341  lung]
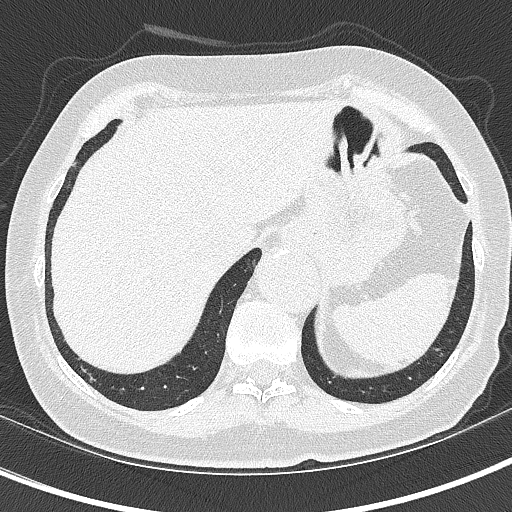
[im 130/341  mediastinal]
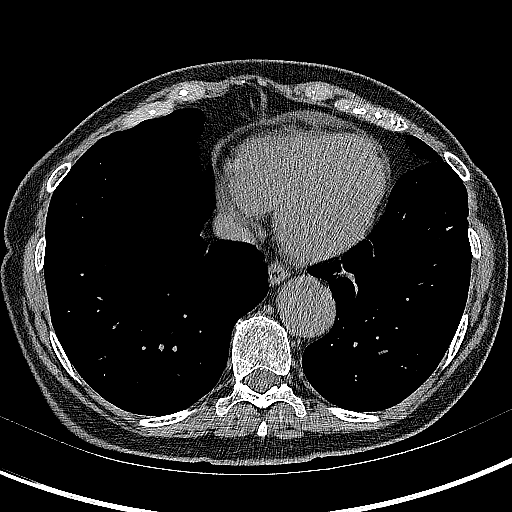
[im 130/341  lung]
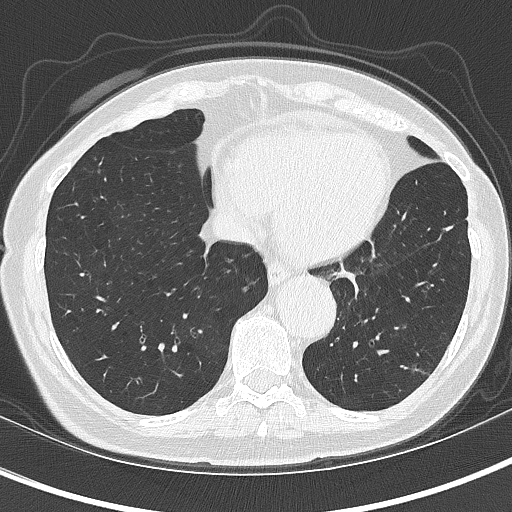
[im 162/341  lung]
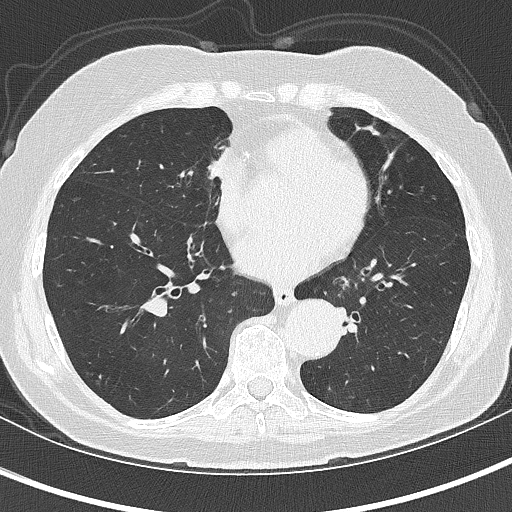
[im 179/341  lung]
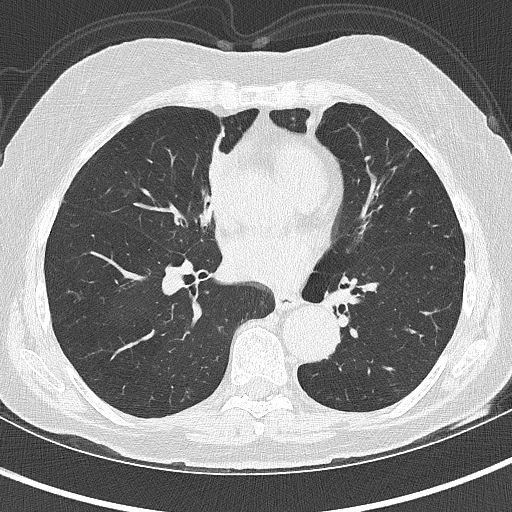
[im 211/341  lung]
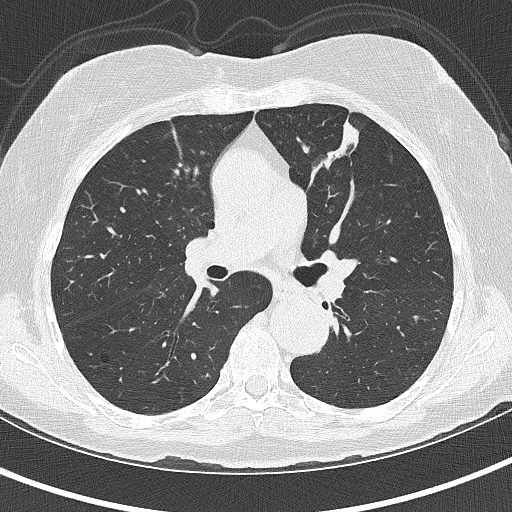
[im 243/341  mediastinal]
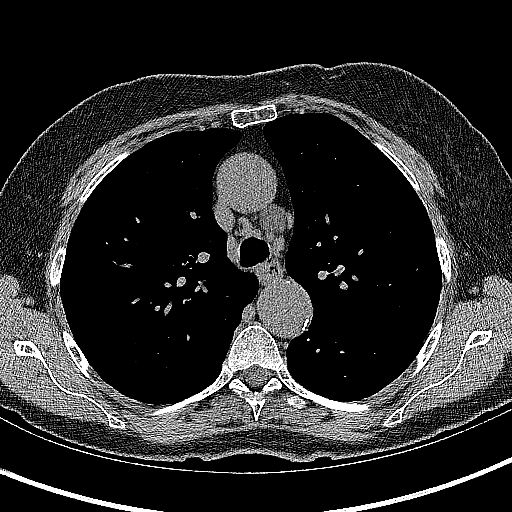
[im 243/341  lung]
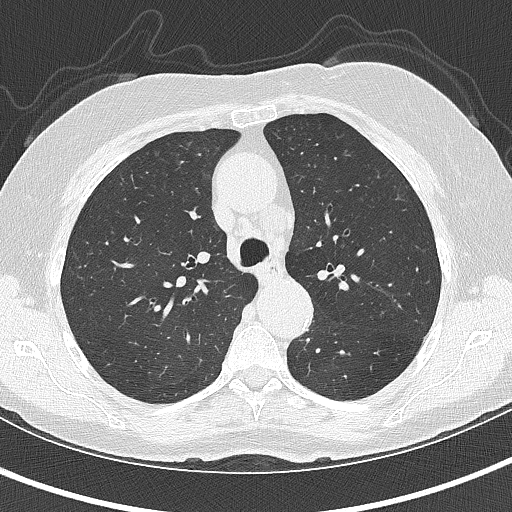
[im 260/341  lung]
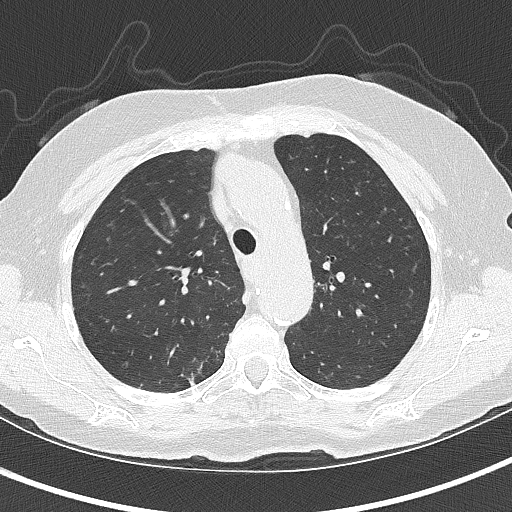
[im 292/341  lung]
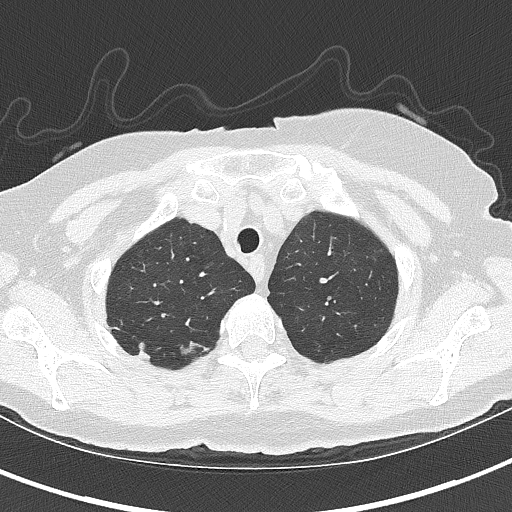
[im 324/341  lung]
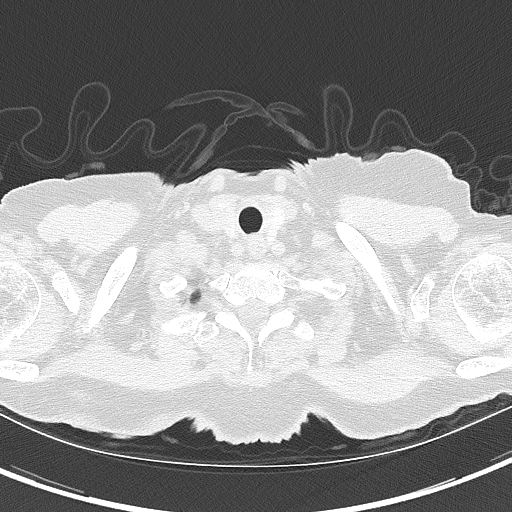

[Series 4: lcs f/u 1.00 cor · coronal · 0.58mm/px · 3 of 246 slices shown]
[im 50/246  lung]
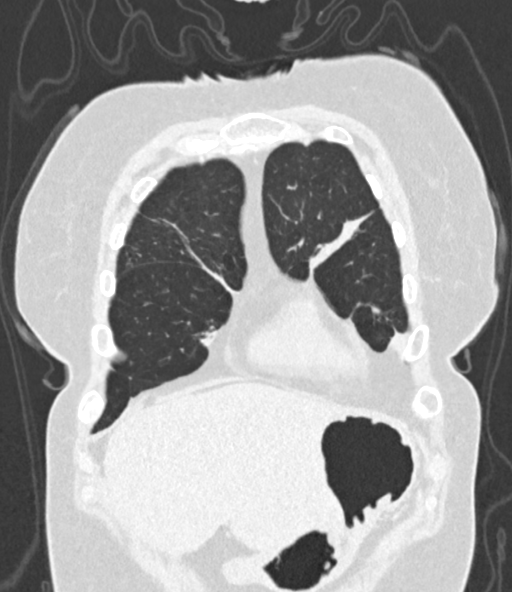
[im 99/246  lung]
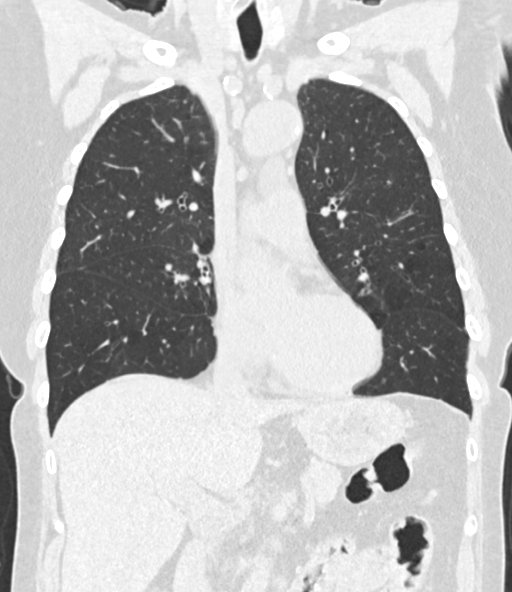
[im 148/246  lung]
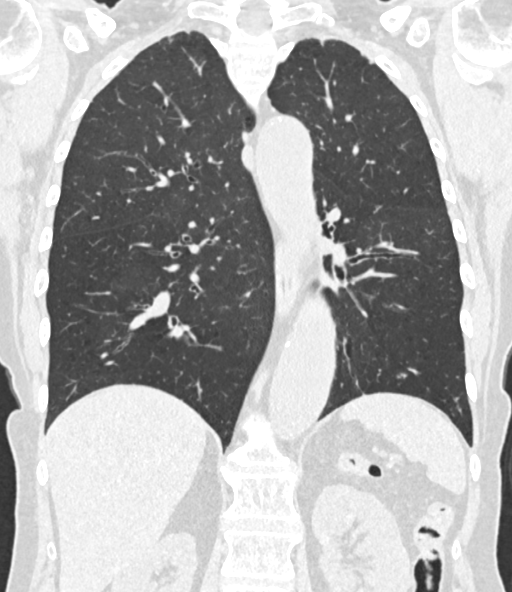

[15 of 40 positions shown; findings below may reference images not displayed]

FINDINGS: Cardiovascular: Atherosclerotic calcification of the aorta and
coronary arteries. Heart size normal. No pericardial effusion.

Mediastinum/Nodes: No pathologically enlarged mediastinal or
axillary lymph nodes. Hilar regions are difficult to definitively
evaluate without IV contrast but appear grossly unremarkable.
Esophagus is grossly unremarkable.

Lungs/Pleura: Biapical pleuroparenchymal scarring. Mild
centrilobular emphysema. Subpleural ground-glass in the posterior
right upper lobe is new and likely infectious or inflammatory in
etiology or due to postinfectious scarring. Additional scarring in
anterior segment right upper lobe, right middle lobe and lingula.
Scattered mucoid impaction. Pulmonary nodules measure 7.7 mm or less
in size, as before. No pleural fluid. Adherent debris in the
trachea. Airway is otherwise unremarkable.

Upper Abdomen: Visualized portions of the liver, gallbladder,
adrenal glands and right kidney are unremarkable. Stones are seen in
the left kidney. Low-attenuation lesions in the kidneys measure up
to 1.4 cm, too small to characterize and/or incompletely imaged.
Visualized portions of the spleen, pancreas, stomach and bowel are
otherwise grossly unremarkable. No upper abdominal adenopathy.

Musculoskeletal: Degenerative changes in the spine. No worrisome
lytic or sclerotic lesions.
IMPRESSION: 1. Lung-RADS 2, benign appearance or behavior. Continue annual
screening with low-dose chest CT without contrast in 12 months.
2. Left renal stones.
3. Aortic atherosclerosis ([id number]). Coronary artery
calcification.
4.  Emphysema ([id number]).

## 2021-07-16 ENCOUNTER — Other Ambulatory Visit: Payer: Self-pay

## 2021-07-18 ENCOUNTER — Telehealth: Payer: Self-pay | Admitting: Acute Care

## 2021-07-18 NOTE — Telephone Encounter (Signed)
Attempted to reach pt to schedule annual LDCT-LVMM 

## 2021-07-20 ENCOUNTER — Telehealth: Payer: Self-pay | Admitting: *Deleted

## 2021-07-20 ENCOUNTER — Other Ambulatory Visit: Payer: Self-pay | Admitting: *Deleted

## 2021-07-20 DIAGNOSIS — F17210 Nicotine dependence, cigarettes, uncomplicated: Secondary | ICD-10-CM

## 2021-07-20 DIAGNOSIS — Z87891 Personal history of nicotine dependence: Secondary | ICD-10-CM

## 2021-07-20 DIAGNOSIS — Z122 Encounter for screening for malignant neoplasm of respiratory organs: Secondary | ICD-10-CM

## 2021-07-20 NOTE — Telephone Encounter (Signed)
Spoke with pt regarding yearly lung screening CT scan. I advised pt that Her insurance blocked the lung screening CT order due to age restrictions. I explained to pt that some Medicare plans will cover lung screening up to age 80 and some up to age 50 but her insurance will no longer cover. PT verbalized understanding and states that she will discuss any further follow up with her PCP.

## 2021-12-26 ENCOUNTER — Encounter: Payer: Self-pay | Admitting: Emergency Medicine

## 2021-12-26 ENCOUNTER — Inpatient Hospital Stay
Admission: EM | Admit: 2021-12-26 | Discharge: 2021-12-29 | DRG: 871 | Disposition: A | Discharge status: Home / Self Care | Payer: Medicare Other | Attending: Internal Medicine | Admitting: Internal Medicine

## 2021-12-26 ENCOUNTER — Other Ambulatory Visit: Payer: Self-pay

## 2021-12-26 ENCOUNTER — Ambulatory Visit (INDEPENDENT_AMBULATORY_CARE_PROVIDER_SITE_OTHER)
Admission: EM | Admit: 2021-12-26 | Discharge: 2021-12-26 | Disposition: A | Discharge status: Home / Self Care | Payer: Medicare Other | Source: Home / Self Care

## 2021-12-26 ENCOUNTER — Observation Stay: Payer: Medicare Other

## 2021-12-26 ENCOUNTER — Emergency Department: Payer: Medicare Other

## 2021-12-26 DIAGNOSIS — N179 Acute kidney failure, unspecified: Secondary | ICD-10-CM | POA: Diagnosis present

## 2021-12-26 DIAGNOSIS — A419 Sepsis, unspecified organism: Secondary | ICD-10-CM | POA: Diagnosis not present

## 2021-12-26 DIAGNOSIS — F17210 Nicotine dependence, cigarettes, uncomplicated: Secondary | ICD-10-CM | POA: Diagnosis present

## 2021-12-26 DIAGNOSIS — J9601 Acute respiratory failure with hypoxia: Secondary | ICD-10-CM | POA: Insufficient documentation

## 2021-12-26 DIAGNOSIS — Z1152 Encounter for screening for COVID-19: Secondary | ICD-10-CM | POA: Insufficient documentation

## 2021-12-26 DIAGNOSIS — E7800 Pure hypercholesterolemia, unspecified: Secondary | ICD-10-CM

## 2021-12-26 DIAGNOSIS — R509 Fever, unspecified: Secondary | ICD-10-CM | POA: Insufficient documentation

## 2021-12-26 DIAGNOSIS — I10 Essential (primary) hypertension: Secondary | ICD-10-CM | POA: Diagnosis present

## 2021-12-26 DIAGNOSIS — N200 Calculus of kidney: Secondary | ICD-10-CM | POA: Diagnosis present

## 2021-12-26 DIAGNOSIS — R051 Acute cough: Secondary | ICD-10-CM | POA: Insufficient documentation

## 2021-12-26 DIAGNOSIS — Z8701 Personal history of pneumonia (recurrent): Secondary | ICD-10-CM

## 2021-12-26 DIAGNOSIS — J441 Chronic obstructive pulmonary disease with (acute) exacerbation: Secondary | ICD-10-CM

## 2021-12-26 DIAGNOSIS — I77819 Aortic ectasia, unspecified site: Secondary | ICD-10-CM | POA: Insufficient documentation

## 2021-12-26 DIAGNOSIS — J189 Pneumonia, unspecified organism: Secondary | ICD-10-CM | POA: Diagnosis present

## 2021-12-26 DIAGNOSIS — I700 Atherosclerosis of aorta: Secondary | ICD-10-CM | POA: Diagnosis present

## 2021-12-26 DIAGNOSIS — R6520 Severe sepsis without septic shock: Secondary | ICD-10-CM | POA: Diagnosis present

## 2021-12-26 DIAGNOSIS — J9621 Acute and chronic respiratory failure with hypoxia: Secondary | ICD-10-CM | POA: Insufficient documentation

## 2021-12-26 DIAGNOSIS — J440 Chronic obstructive pulmonary disease with acute lower respiratory infection: Secondary | ICD-10-CM | POA: Diagnosis present

## 2021-12-26 LAB — BASIC METABOLIC PANEL
Anion gap: 13 (ref 5–15)
BUN: 43 mg/dL — ABNORMAL HIGH (ref 8–23)
CO2: 21 mmol/L — ABNORMAL LOW (ref 22–32)
Calcium: 9.4 mg/dL (ref 8.9–10.3)
Chloride: 104 mmol/L (ref 98–111)
Creatinine, Ser: 1.73 mg/dL — ABNORMAL HIGH (ref 0.44–1.00)
GFR, Estimated: 30 mL/min — ABNORMAL LOW (ref >60)
Glucose, Bld: 117 mg/dL — ABNORMAL HIGH (ref 70–99)
Potassium: 4 mmol/L (ref 3.5–5.1)
Sodium: 138 mmol/L (ref 135–145)

## 2021-12-26 LAB — CBC WITH DIFFERENTIAL/PLATELET
Abs Immature Granulocytes: 0.03 10*3/uL (ref 0.00–0.07)
Basophils Absolute: 0 10*3/uL (ref 0.0–0.1)
Basophils Relative: 0 %
Eosinophils Absolute: 0 10*3/uL (ref 0.0–0.5)
Eosinophils Relative: 0 %
HCT: 39 % (ref 36.0–46.0)
Hemoglobin: 12.8 g/dL (ref 12.0–15.0)
Immature Granulocytes: 0 %
Lymphocytes Relative: 13 %
Lymphs Abs: 1.1 10*3/uL (ref 0.7–4.0)
MCH: 29.5 pg (ref 26.0–34.0)
MCHC: 32.8 g/dL (ref 30.0–36.0)
MCV: 89.9 fL (ref 80.0–100.0)
Monocytes Absolute: 0.6 10*3/uL (ref 0.1–1.0)
Monocytes Relative: 7 %
Neutro Abs: 7 10*3/uL (ref 1.7–7.7)
Neutrophils Relative %: 80 %
Platelets: 189 10*3/uL (ref 150–400)
RBC: 4.34 MIL/uL (ref 3.87–5.11)
RDW: 13.9 % (ref 11.5–15.5)
WBC: 8.8 10*3/uL (ref 4.0–10.5)
nRBC: 0 % (ref 0.0–0.2)

## 2021-12-26 LAB — RESP PANEL BY RT-PCR (FLU A&B, COVID) ARPGX2
Influenza A by PCR: NEGATIVE
Influenza B by PCR: NEGATIVE
SARS Coronavirus 2 by RT PCR: NEGATIVE

## 2021-12-26 LAB — RESP PANEL BY RT-PCR (RSV, FLU A&B, COVID)  RVPGX2
Influenza A by PCR: NEGATIVE
Influenza B by PCR: NEGATIVE
Resp Syncytial Virus by PCR: NEGATIVE
SARS Coronavirus 2 by RT PCR: NEGATIVE

## 2021-12-26 LAB — LACTIC ACID, PLASMA
Lactic Acid, Venous: 0.9 mmol/L (ref 0.5–1.9)
Lactic Acid, Venous: 1.4 mmol/L (ref 0.5–1.9)

## 2021-12-26 MED ORDER — SODIUM CHLORIDE 0.9 % IV SOLN
1.0000 g | Freq: Once | INTRAVENOUS | Status: AC
  Administered 2021-12-26: 1 g via INTRAVENOUS
  Filled 2021-12-26: qty 10

## 2021-12-26 MED ORDER — ENOXAPARIN SODIUM 30 MG/0.3ML IJ SOSY
30.0000 mg | PREFILLED_SYRINGE | INTRAMUSCULAR | Status: DC
  Administered 2021-12-27 – 2021-12-28 (×3): 30 mg via SUBCUTANEOUS
  Filled 2021-12-26 (×3): qty 0.3

## 2021-12-26 MED ORDER — SODIUM CHLORIDE 0.9 % IV SOLN
2.0000 g | INTRAVENOUS | Status: DC
  Administered 2021-12-27 – 2021-12-28 (×2): 2 g via INTRAVENOUS
  Filled 2021-12-26 (×3): qty 20

## 2021-12-26 MED ORDER — METHYLPREDNISOLONE SODIUM SUCC 125 MG IJ SOLR
125.0000 mg | Freq: Once | INTRAMUSCULAR | Status: AC
  Administered 2021-12-27: 125 mg via INTRAVENOUS
  Filled 2021-12-26: qty 2

## 2021-12-26 MED ORDER — ACETAMINOPHEN 500 MG PO TABS
1000.0000 mg | ORAL_TABLET | Freq: Once | ORAL | Status: AC
  Administered 2021-12-26: 1000 mg via ORAL

## 2021-12-26 MED ORDER — FENOFIBRATE 160 MG PO TABS
160.0000 mg | ORAL_TABLET | Freq: Every day | ORAL | Status: DC
  Administered 2021-12-28 – 2021-12-29 (×2): 160 mg via ORAL
  Filled 2021-12-26 (×3): qty 1

## 2021-12-26 MED ORDER — PREDNISONE 20 MG PO TABS
40.0000 mg | ORAL_TABLET | Freq: Every day | ORAL | Status: DC
  Administered 2021-12-27 – 2021-12-28 (×2): 40 mg via ORAL
  Filled 2021-12-26 (×2): qty 2

## 2021-12-26 MED ORDER — SODIUM CHLORIDE 0.9 % IV SOLN
500.0000 mg | INTRAVENOUS | Status: DC
  Administered 2021-12-27 – 2021-12-28 (×3): 500 mg via INTRAVENOUS
  Filled 2021-12-26 (×4): qty 5

## 2021-12-26 MED ORDER — LACTATED RINGERS IV SOLN: INTRAVENOUS | Status: DC

## 2021-12-26 MED ORDER — SODIUM CHLORIDE 0.9 % IV BOLUS
1000.0000 mL | Freq: Once | INTRAVENOUS | Status: AC
  Administered 2021-12-26: 1000 mL via INTRAVENOUS

## 2021-12-26 NOTE — Assessment & Plan Note (Signed)
-  Continue home fenofibrate 

## 2021-12-26 NOTE — Assessment & Plan Note (Addendum)
Shelley Thomas is presenting with 1 week history of fever, malaise, body aches, productive cough and SOB with hypoxia present at urgent care down to 87% and CXR concerning for multifocal pneumonia.  Given history of recurrent pneumonia, most recent CT chest for lung cancer screening personally reviewed.  Some evidence of traction bronchiectasis.  Due to this, we will repeat CT chest for reevaluation, as patient may need broadened antimicrobial coverage if present.  - Continue supplemental oxygen to maintain O2 saturation above 88 % - Wean as tolerated - CT chest pending - Continue ceftriaxone and azithromycin - RVP pending - Strep pneumo and Legionella urinary antigen pending - Sputum cultures ordered

## 2021-12-26 NOTE — ED Notes (Signed)
Patient is being discharged from the Urgent Care and sent to the Emergency Department via EMS . Per Eusebio Friendly, PA, patient is in need of higher level of care due to hypoxia. Patient is aware and verbalizes understanding of plan of care.  Vitals:   12/26/21 1445 12/26/21 1446  BP:    Pulse:    Resp:    Temp:    SpO2: (!) 86% 94%

## 2021-12-26 NOTE — ED Provider Triage Note (Signed)
Emergency Medicine Provider Triage Evaluation Note  Shelley Thomas, a 81 y.o. female  was evaluated in triage.  Pt complains of cough with hoarseness, with onset this morning.  Patient also was found to have room air sats at only 87% Tmax of 102 F, tachycardic in the 110s, when she reported to a local urgent care.  She presents to the ED via EMS from the urgent care for evaluation of her new O2 requirement.  Review of Systems  Positive: Cough, fever, tachycardia Negative: SOB  Physical Exam  SpO2 (!) 87%  Gen:   Awake, no distress  NAD Resp:  Normal effort CTA MSK:   Moves extremities without difficulty  Other:    Medical Decision Making  Medically screening exam initiated at 4:36 PM.  Appropriate orders placed.  Shelley Thomas was informed that the remainder of the evaluation will be completed by another provider, this initial triage assessment does not replace that evaluation, and the importance of remaining in the ED until their evaluation is complete.  Geriatric patient to the ED for evaluation of sudden onset of cough with hoarse voice, as well as some new O2 requirement after she was found to have O2 sats around 87% on room air.   Lissa Hoard, PA-C 12/26/21 1639

## 2021-12-26 NOTE — Discharge Instructions (Signed)

## 2021-12-26 NOTE — Assessment & Plan Note (Signed)
Patient denies any prior history of known COPD.  However, patient has a extensive smoking history and CT imaging with moderate emphysematous changes.  On examination, there is diffuse expiratory wheezing.  Clinically, this is consistent with underlying COPD with now acute exacerbation.  Will need outpatient PFTs to confirm diagnosis.  - Solu-Medrol one-time dose - Start prednisone 40 mg tomorrow - DuoNebs every 6 hours - Will likely need maintenance bronchodilators on discharge

## 2021-12-26 NOTE — ED Triage Notes (Addendum)
Pt c/o cough, hoarseness, body aches, runny nose. Started about 3 days.

## 2021-12-26 NOTE — ED Provider Notes (Signed)
[facility]    CSN: [id number] Arrival date & time: 12/26/21  1131      History   Chief Complaint Chief Complaint  Patient presents with   Cough    HPI Shelley Thomas is a 81 y.o. female presenting with her husband for fever, cough, retractions, body aches, nasal congestion/runny nose and shortness of breath x3 days.  She denies any chest pain, chest tightness, leg swelling.  No report of abdominal pain, vomiting or diarrhea.  She says she saw her PCP last week because she was having balance problems and had lab work done which was normal.  She says she was not sick at that time.  She denies any sick contacts.  She denies any history of asthma or COPD.  She is a cigarette smoker for a long period of time.  HPI  Past Medical History:  Diagnosis Date   Back pain    High cholesterol    Hypertension     Patient Active Problem List   Diagnosis Date Noted   Personal history of tobacco use, presenting hazards to health 06/20/2016    Past Surgical History:  Procedure Laterality Date   ABDOMINAL HYSTERECTOMY     TUMOR REMOVAL      OB History   No obstetric history on file.      Home Medications    Prior to Admission medications   Medication Sig Start Date End Date Taking? Authorizing Provider  fenofibrate (TRICOR) 145 MG tablet Take 145 mg by mouth daily.   Yes [provider]  lisinopril (PRINIVIL,ZESTRIL) 20 MG tablet Take 20 mg by mouth 2 (two) times daily. 11/07/17  Yes [provider]  oxyCODONE-acetaminophen (PERCOCET/ROXICET) 5-325 MG tablet 1-2 tabs po q 8 hours prn 04/08/17   Payton Mccallum, MD  traMADol (ULTRAM) 50 MG tablet Take by mouth every 6 (six) hours as needed.    [provider]    Family History No family history on file.  Social History Social History   Tobacco Use   Smoking status: Every Day    Packs/day: 0.50    Years: 50.00    Total pack years: 25.00    Types: Cigarettes   Smokeless tobacco:  Never  Vaping Use   Vaping Use: Never used  Substance Use Topics   Alcohol use: No   Drug use: No     Allergies   Patient has no known allergies.   Review of Systems Review of Systems  Constitutional:  Positive for fatigue and fever. Negative for chills and diaphoresis.  HENT:  Positive for congestion and rhinorrhea. Negative for ear pain, sinus pressure, sinus pain and sore throat.   Respiratory:  Positive for cough and shortness of breath.   Gastrointestinal:  Negative for abdominal pain, nausea and vomiting.  Musculoskeletal:  Positive for myalgias. Negative for arthralgias.  Skin:  Negative for rash.  Neurological:  Negative for weakness and headaches.  Hematological:  Negative for adenopathy.     Physical Exam Triage Vital Signs ED Triage Vitals  Enc Vitals Group     BP 12/26/21 1423 110/71     Pulse Rate 12/26/21 1423 (!) 117     Resp 12/26/21 1423 (!) 24     Temp 12/26/21 1423 (!) 102.2 F (39 C)     Temp Source 12/26/21 1423 Oral     SpO2 12/26/21 1423 (!) 87 %     Weight 12/26/21 1420 110 lb 0.2 oz (49.9 kg)     Height  12/26/21 1420 5\' 2"  (1.575 m)     Head Circumference --      Peak Flow --      Pain Score 12/26/21 1420 8     Pain Loc --      Pain Edu? --      Excl. in GC? --    No data found.  Updated Vital Signs BP 110/71 (BP Location: Right Arm)   Pulse (!) 117   Temp (!) 102.2 F (39 C) (Oral)   Resp (!) 24   Ht 5\' 2"  (1.575 m)   Wt 110 lb 0.2 oz (49.9 kg)   SpO2 94%   BMI 20.12 kg/m       Physical Exam Vitals and nursing note reviewed.  Constitutional:      General: She is not in acute distress.    Appearance: Normal appearance. She is ill-appearing. She is not toxic-appearing.  HENT:     Head: Normocephalic and atraumatic.     Nose: Congestion present.     Mouth/Throat:     Mouth: Mucous membranes are moist.     Pharynx: Oropharynx is clear.  Eyes:     General: No scleral icterus.       Right eye: No discharge.        Left  eye: No discharge.     Conjunctiva/sclera: Conjunctivae normal.  Cardiovascular:     Rate and Rhythm: Regular rhythm. Tachycardia present.     Heart sounds: Normal heart sounds.  Pulmonary:     Effort: Respiratory distress present.     Comments: Increased respiratory rate.  Patient speaks about 3 words and then takes a pause for breath.  She is tripoding and grunting.  Diminished breath sounds throughout. Musculoskeletal:     Cervical back: Neck supple.     Right lower leg: No edema.     Left lower leg: No edema.  Skin:    General: Skin is dry.  Neurological:     General: No focal deficit present.     Mental Status: She is alert. Mental status is at baseline.     Motor: No weakness.     Gait: Gait normal.  Psychiatric:        Mood and Affect: Mood normal.        Behavior: Behavior normal.        Thought Content: Thought content normal.      UC Treatments / Results  Labs (all labs ordered are listed, but only abnormal results are displayed) Labs Reviewed  RESP PANEL BY RT-PCR (RSV, FLU A&B, COVID)  RVPGX2    EKG   Radiology No results found.  Procedures Procedures (including critical care time)  Medications Ordered in UC Medications  acetaminophen (TYLENOL) tablet 1,000 mg (1,000 mg Oral Given 12/26/21 1431)    Initial Impression / Assessment and Plan / UC Course  I have reviewed the triage vital signs and the nursing notes.  Pertinent labs & imaging results that were available during my care of the patient were reviewed by me and considered in my medical decision making (see chart for details).   81 year old female with history of smoking and hypertension presents for 3-day history of fever, fatigue, body aches, cough, congestion.   Temp currently 102.2 degrees.  Patient given Tylenol.  She is tachycardic at 117 bpm.  Respiratory rate elevated to 24.  Initial oxygen saturation is 87%.  Patient placed on 3 L and oxygen rises to 94%.  She is initially tripoding  and  speaking in a couple of words or taking of breath and grunting.  Diminished breath sounds throughout.  After oxygen is placed she is breathing a little better.  She does not have any swelling of her extremities.  Heart regular rhythm.  Advised patient and husband she is in acute respiratory failure.  Explained that it could be due to pneumonia, COVID, flu or even sepsis.  Her vitals do meet sepsis criteria.  Advised she needs further work-up immediately in the emergency department.  Advise EMS transport.  They are reluctant but agreeable to EMS transport.  I call and give report to 911 operator. Report given to EMS. Patient leaving in stable condition.   Patient will likely have thorough work-up to include labs-blood work and respiratory panel, CT imaging and/or chest x-ray, breathing treatments and possible steroids and antibiotics.  I expect her to be admitted if her oxygen is not improving.  Final Clinical Impressions(s) / UC Diagnoses   Final diagnoses:  Acute respiratory failure with hypoxia (HCC)  Acute cough  Fever, unspecified     Discharge Instructions      You have been advised to follow up immediately in the emergency department for concerning signs.symptoms. If you declined EMS transport, please have a family member take you directly to the ED at this time. Do not delay. Based on concerns about condition, if you do not follow up in th e ED, you may risk poor outcomes including worsening of condition, delayed treatment and potentially life threatening issues. If you have declined to go to the ED at this time, you should call your PCP immediately to set up a follow up appointment.  Go to ED for red flag symptoms, including; fevers you cannot reduce with Tylenol/Motrin, severe headaches, vision changes, numbness/weakness in part of the body, lethargy, confusion, intractable vomiting, severe dehydration, chest pain, breathing difficulty, severe persistent abdominal or pelvic pain,  signs of severe infection (increased redness, swelling of an area), feeling faint or passing out, dizziness, etc. You should especially go to the ED for sudden acute worsening of condition if you do not elect to go at this time.    ED Prescriptions   None    PDMP not reviewed this encounter.   Shirlee Latch, PA-C 12/26/21 [phone]

## 2021-12-26 NOTE — ED Triage Notes (Signed)
Pt was at Urgent Care today at approx 1400 for "hoarseness and cough" for 2 days.  Pt was found with prior charted VS and placed on 4 lpm [place] O2 and give tylenol and then EMS was called for transport to this ED.  Pt reports husband admin'd COVID test this am which was negative.   Pt was given flu shot last week.  Takes meds for high cholesterol and HTN.

## 2021-12-26 NOTE — ED Triage Notes (Signed)
Came by EMS from [facility] urgent care, cough no shob. Sats RA 87% per urgent care, EMS states they had her on 4l. Pt not on oxygen at home.

## 2021-12-26 NOTE — Progress Notes (Signed)
PHARMACIST - PHYSICIAN COMMUNICATION  CONCERNING:  Enoxaparin (Lovenox) for DVT Prophylaxis   RECOMMENDATION: Patient was prescribed enoxaparin 40mg  q24 hours for VTE prophylaxis.   Filed Weights   12/26/21 1645  Weight: 46.3 kg (102 lb)    Body mass index is 18.66 kg/m.  Estimated Creatinine Clearance: 19 mL/min (A) (by C-G formula based on SCr of 1.73 mg/dL (H)).  Patient is candidate for enoxaparin 30mg  every 24 hours based on CrCl <45ml/min or Weight <45kg  DESCRIPTION: Pharmacy has adjusted enoxaparin dose per [facility] policy.  Patient is now receiving enoxaparin 30 mg every 24 hours   31m 12/26/2021 8:48 PM

## 2021-12-26 NOTE — Assessment & Plan Note (Signed)
-   Holding home lisinopril due to AKI

## 2021-12-26 NOTE — ED Provider Notes (Signed)
[facility] Provider Note  Patient Contact: 7:34 PM (approximate)   History   Cough, Shortness of Breath, Hoarse, Fever, and Tachycardia   HPI  Shelley Thomas is a 81 y.o. female who presents emergency department from urgent care for complaint of possible pneumonia versus sepsis.  Patient was complaining of roughly a week of fever.  She is started to have some cough and shortness of breath beginning yesterday.  Patient has a history of recurrent pneumonia.  After having symptoms with worsening cough patient went to urgent care earlier today, was noted to be tachycardic, tachypneic, hypoxic and febrile.  Patient was referred to the emergency department for evaluation.  Patient has new oxygen demand.  Without oxygen she is saturating in the mid 80 percentile, with 4 L of oxygen she has bumped up to the mid 90s percentile.  Patient with no headache, nasal congestion, sore throat.  No GI complaints.  No urinary complaints.     Physical Exam   Triage Vital Signs: ED Triage Vitals  Enc Vitals Group     BP 12/26/21 1643 100/75     Pulse Rate 12/26/21 1643 (!) 102     Resp 12/26/21 1643 (!) 27     Temp 12/26/21 1643 98.9 F (37.2 C)     Temp Source 12/26/21 1643 Oral     SpO2 12/26/21 1629 (!) 87 %     Weight 12/26/21 1645 102 lb (46.3 kg)     Height 12/26/21 1645 5\' 2"  (1.575 m)     Head Circumference --      Peak Flow --      Pain Score 12/26/21 1645 0     Pain Loc --      Pain Edu? --      Excl. in GC? --     Most recent vital signs: Vitals:   12/26/21 1643 12/26/21 1930  BP: 100/75 131/85  Pulse: (!) 102 94  Resp: (!) 27 (!) 30  Temp: 98.9 F (37.2 C)   SpO2: 93% 95%     General: Alert and in no acute distress. ENT:      Ears:       Nose: No congestion/rhinnorhea.      Mouth/Throat: Mucous membranes are moist. Neck: No stridor. No cervical spine tenderness to palpation. Hematological/Lymphatic/Immunilogical: No cervical  lymphadenopathy. Cardiovascular:  Good peripheral perfusion Respiratory: Normal respiratory effort with tachypnea but retractions. Lungs with some upper respiratory wheezing, lower crackles.  Good air entry to the bases with no decreased or absent breath sounds. Gastrointestinal: Bowel sounds 4 quadrants. Soft and nontender to palpation. No guarding or rigidity. No palpable masses. No distention. No CVA tenderness. Musculoskeletal: Full range of motion to all extremities.  Neurologic:  No gross focal neurologic deficits are appreciated.  Skin:   No rash noted Other:   ED Results / Procedures / Treatments   Labs (all labs ordered are listed, but only abnormal results are displayed) Labs Reviewed  BASIC METABOLIC PANEL - Abnormal; Notable for the following components:      Result Value   CO2 21 (*)    Glucose, Bld 117 (*)    BUN 43 (*)    Creatinine, Ser 1.73 (*)    GFR, Estimated 30 (*)    All other components within normal limits  RESP PANEL BY RT-PCR (FLU A&B, COVID) ARPGX2  CULTURE, BLOOD (ROUTINE X 2)  CULTURE, BLOOD (ROUTINE X 2)  LACTIC ACID, PLASMA  CBC WITH DIFFERENTIAL/PLATELET  LACTIC  ACID, PLASMA  URINALYSIS, COMPLETE (UACMP) WITH MICROSCOPIC     EKG     RADIOLOGY  I personally viewed, evaluated, and interpreted these images as part of my medical decision making, as well as reviewing the written report by the radiologist.  ED Provider Interpretation: Patient with opacities in lower lungs concerning for pneumonia.  DG Chest 2 View  Result Date: 12/26/2021 CLINICAL DATA:  Cough and fever. EXAM: CHEST - 2 VIEW COMPARISON:  Radiograph 06/12/2017, CT 05/20/2020 FINDINGS: The heart is normal in size. Aortic atherosclerosis. There are ill-defined opacities within both lungs suspicious for atypical infection. Background chronic hyperinflation and bronchial thickening. Lingular scarring. No pleural effusion or pneumothorax. No acute osseous findings. IMPRESSION: 1.  Ill-defined opacities within both lungs suspicious for atypical infection. 2. Background chronic hyperinflation and bronchial thickening. Electronically Signed   By: Narda Rutherford M.D.   On: 12/26/2021 17:20    PROCEDURES:  Critical Care performed: No  Procedures   MEDICATIONS ORDERED IN ED: Medications  cefTRIAXone (ROCEPHIN) 1 g in sodium chloride 0.9 % 100 mL IVPB (1 g Intravenous New Bag/Given 12/26/21 1942)  sodium chloride 0.9 % bolus 1,000 mL (1,000 mLs Intravenous New Bag/Given 12/26/21 1942)     IMPRESSION / MDM / ASSESSMENT AND PLAN / ED COURSE  I reviewed the triage vital signs and the nursing notes.                              Differential diagnosis includes, but is not limited to, sepsis, pneumonia, COVID, influenza, viral illness, bronchitis  Patient's presentation is most consistent with acute presentation with potential threat to life or bodily function.   Patient's diagnosis is consistent with community-acquired pneumonia, sepsis.  Patient presents emergency department meeting sepsis criteria.  She has had a fever for a week, cough and shortness of breath starting yesterday.  Patient went to urgent care and was referred to the emergency department after concerning vitals.  Patient has new oxygen demand requiring 4 L via nasal cannula to maintain oxygen saturation in the mid 90 percentile.  Without oxygen she quickly desaturates into the mid to low 80 percentile.  Patient states that she had a fever for roughly a week, developed respiratory symptoms such as cough and shortness of breath yesterday.  No nasal congestion, sore throat.  No GI complaints.  No chest pain.  At this time, white blood cell count is reassuring, labs are generally reassuring.  COVID is negative.  Flu is negative.  Patient has findings on x-ray concerning for bilateral pneumonia which matches patient's presentation.  Fluids, antibiotics started at this time.  Given the vital sign changes and oxygen  demand, patient will be admitted to the hospitalist service.          FINAL CLINICAL IMPRESSION(S) / ED DIAGNOSES   Final diagnoses:  Community acquired pneumonia, unspecified laterality  Sepsis with acute hypoxic respiratory failure without septic shock, due to unspecified organism [facility])     Rx / DC Orders   ED Discharge Orders     None        Note:  This document was prepared using Dragon voice recognition software and may include unintentional dictation errors.   Lanette Hampshire 12/26/21 2007    Jene Every, MD 12/26/21 2021

## 2021-12-26 NOTE — ED Notes (Signed)
Patient is being discharged from the Urgent Care and sent to the Emergency Department via EMS . Per Athena Masse, PA, patient is in need of higher level of care due to Hypoxia. Patient is aware and verbalizes understanding of plan of care.  Vitals:   12/26/21 1423 12/26/21 1439  BP: 110/71   Pulse: (!) 117   Resp: (!) 24   Temp: (!) 102.2 F (39 C)   SpO2: (!) 87% 94%

## 2021-12-26 NOTE — Assessment & Plan Note (Signed)
No prior BMPs in chart for comparison.  Most likely secondary to poor p.o. intake over the past 1 week.  - IV fluid resuscitation - Renal ultrasound - Repeat BMP in the a.m. - Monitor urinary output - Avoid nephrotoxic agents, including holding home lisinopril

## 2021-12-26 NOTE — H&P (Addendum)
History and Physical    Patient: Shelley Thomas [id number] DOB: 11/10/1940 DOA: 12/26/2021 DOS: the patient was seen and examined on 12/26/2021 PCP: Shelley Billet, MD  Patient coming from: Home  Chief Complaint:  Chief Complaint  Patient presents with   Cough   Shortness of Breath   Hoarse   Fever   Tachycardia   HPI: Shelley Thomas is a 81 y.o. female with medical history significant of hypertension, hyperlipidemia, who presents to the ED with complaints of shortness of breath.  Shelley Thomas states that she has been experiencing malaise, generalized weakness, body aches and fever with poor p.o. intake for approximately 1 week.  Several days ago, she began to experience increased cough with sputum production.  Subsequently, she developed shortness of breath over the last 3 days.  She went to the urgent care, at which time she was found to be hypoxic down to 87% on room air.  She was febrile at 102.2.  Due to this, she was sent to the ED.  Of note, she also noted voice hoarseness that started today.  Otherwise, Shelley Thomas denies any chest pain, palpitations, nausea, vomiting, diarrhea, lower extremity swelling.  Shelley Thomas states that she has a long-term history of frequent pneumonia.  She has never been diagnosed with COPD or asthma in the past.  She continues to smoke approximately half a pack per day.  ED course: On arrival to the ED, patient was tachycardic up to 117 with blood pressure of 110/71.  She was saturating at 94% on 2 L of nasal cannula.  She was febrile at 102.2.  Initial work-up remarkable for creatinine of 1.73.CT chest with ill-defined opacities within Thomas lungs suspicious for atypical infection.  Due to Requiring supplemental oxygen, [facility] contacted for admission.  Review of Systems: As mentioned in the history of present illness. All other systems reviewed and are negative.  Past Medical History:  Diagnosis Date   Back pain    High cholesterol     Hypertension    Past Surgical History:  Procedure Laterality Date   ABDOMINAL HYSTERECTOMY     TUMOR REMOVAL     Social History:  reports that she has been smoking cigarettes. She has a 10.00 pack-year smoking history. She has never used smokeless tobacco. She reports that she does not drink alcohol and does not use drugs.  No Known Allergies  History reviewed. No pertinent family history.  Prior to Admission medications   Medication Sig Start Date End Date Taking? Authorizing Provider  fenofibrate (TRICOR) 145 MG tablet Take 145 mg by mouth daily.   Yes [provider]  lisinopril (PRINIVIL,ZESTRIL) 20 MG tablet Take 20 mg by mouth 2 (two) times daily. 11/07/17  Yes [provider]  oxyCODONE-acetaminophen (PERCOCET/ROXICET) 5-325 MG tablet 1-2 tabs po q 8 hours prn Patient not taking: Reported on 12/26/2021 04/08/17   Shelley Gable, MD  traMADol (ULTRAM) 50 MG tablet Take by mouth every 6 (six) hours as needed. Patient not taking: Reported on 12/26/2021    [provider]    Physical Exam: Vitals:   12/26/21 1643 12/26/21 1645 12/26/21 1930 12/26/21 2200  BP: 100/75  131/85   Pulse: (!) 102  94   Resp: (!) 27  (!) 30   Temp: 98.9 F (37.2 C)   98.8 F (37.1 C)  TempSrc: Oral     SpO2: 93%  95%   Weight:  46.3 kg    Height:  5\' 2"  (1.575 m)  Physical Exam Vitals and nursing note reviewed.  Constitutional:      Appearance: She is underweight.  HENT:     Head: Normocephalic and atraumatic.     Mouth/Throat:     Mouth: Mucous membranes are dry.     Pharynx: Oropharynx is clear.  Eyes:     Extraocular Movements: Extraocular movements intact.     Pupils: Pupils are equal, round, and reactive to light.  Cardiovascular:     Rate and Rhythm: Normal rate and regular rhythm.     Heart sounds: No murmur heard. Pulmonary:     Effort: Tachypnea present.     Breath sounds: Wheezing (Diffuse expiratory wheezing heard throughout) and rales (Fine  inspiratory crackles bilateral middle lower lung fields) present. No decreased breath sounds.  Abdominal:     General: Bowel sounds are normal.     Palpations: Abdomen is soft.  Musculoskeletal:     Cervical back: Neck supple.     Right lower leg: No tenderness. No edema.     Left lower leg: No tenderness. No edema.  Skin:    General: Skin is warm and dry.  Neurological:     General: No focal deficit present.     Mental Status: She is alert and oriented to person, place, and time.  Psychiatric:        Mood and Affect: Mood normal.        Behavior: Behavior normal. Behavior is cooperative.    Data Reviewed: CBC with WBC of 8.8, hemoglobin 12.8, platelets of 189.BNP with creatinine elevated at 1.73 and GFR of 30, bicarb of 21 and glucose of 117.  Lactic acid within normal limits.  Respiratory panel including COVID, influenza, and RSV is negative.  EKG personally reviewed.  Results consistent with sinus rhythm with a rate of 99.  No acute ST or T wave changes to suggest acute ischemia.  2 PVCs noted.  DG Chest 2 View  Result Date: 12/26/2021 CLINICAL DATA:  Cough and fever. EXAM: CHEST - 2 VIEW COMPARISON:  Radiograph 06/12/2017, CT 05/20/2020 FINDINGS: The heart is normal in size. Aortic atherosclerosis. There are ill-defined opacities within Thomas lungs suspicious for atypical infection. Background chronic hyperinflation and bronchial thickening. Lingular scarring. No pleural effusion or pneumothorax. No acute osseous findings. IMPRESSION: 1. Ill-defined opacities within Thomas lungs suspicious for atypical infection. 2. Background chronic hyperinflation and bronchial thickening. Electronically Signed   By: Shelley Thomas M.D.   On: 12/26/2021 17:20    Results are pending, will review when available.  Assessment and Plan: * CAP (community acquired pneumonia) Shelley Thomas is presenting with 1 week history of fever, malaise, body aches, productive cough and SOB with hypoxia present at  urgent care down to 87% and CXR concerning for multifocal pneumonia.  Given history of recurrent pneumonia, most recent CT chest for lung cancer screening personally reviewed.  Some evidence of traction bronchiectasis.  Due to this, we will repeat CT chest for reevaluation, as patient may need broadened antimicrobial coverage if present.  - Continue supplemental oxygen to maintain O2 saturation above 88 % - Wean as tolerated - CT chest pending - Continue ceftriaxone and azithromycin - RVP pending - Strep pneumo and Legionella urinary antigen pending - Sputum cultures ordered  COPD with acute exacerbation [facility]) Patient denies any prior history of known COPD.  However, patient has a extensive smoking history and CT imaging with moderate emphysematous changes.  On examination, there is diffuse expiratory wheezing.  Clinically, this is consistent with underlying  COPD with now acute exacerbation.  Will need outpatient PFTs to confirm diagnosis.  - Solu-Medrol one-time dose - Start prednisone 40 mg tomorrow - DuoNebs every 6 hours - Will likely need maintenance bronchodilators on discharge  AKI (acute kidney injury) ([place]) No prior BMPs in chart for comparison.  Most likely secondary to poor p.o. intake over the past 1 week.  - IV fluid resuscitation - Renal ultrasound - Repeat BMP in the a.m. - Monitor urinary output - Avoid nephrotoxic agents, including holding home lisinopril  Hypertension - Holding home lisinopril due to AKI  High cholesterol - Continue home fenofibrate  Advance Care Planning:   Code Status: Full Code   Consults: None  Family Communication: Patient's husband and son updated at bedside  Severity of Illness: The appropriate patient status for this patient is OBSERVATION. Observation status is judged to be reasonable and necessary in order to provide the required intensity of service to ensure the patient's safety. The patient's presenting symptoms, physical exam  findings, and initial radiographic and laboratory data in the context of their medical condition is felt to place them at decreased risk for further clinical deterioration. Furthermore, it is anticipated that the patient will be medically stable for discharge from the hospital within 2 midnights of admission.   Author: Jose Persia, MD 12/26/2021 11:02 PM  For on call review [email or web address].

## 2021-12-27 ENCOUNTER — Observation Stay: Payer: Medicare Other

## 2021-12-27 ENCOUNTER — Encounter: Payer: Self-pay | Admitting: Internal Medicine

## 2021-12-27 DIAGNOSIS — J9601 Acute respiratory failure with hypoxia: Secondary | ICD-10-CM | POA: Insufficient documentation

## 2021-12-27 DIAGNOSIS — J441 Chronic obstructive pulmonary disease with (acute) exacerbation: Secondary | ICD-10-CM | POA: Diagnosis present

## 2021-12-27 DIAGNOSIS — N200 Calculus of kidney: Secondary | ICD-10-CM | POA: Diagnosis present

## 2021-12-27 DIAGNOSIS — J9621 Acute and chronic respiratory failure with hypoxia: Secondary | ICD-10-CM | POA: Insufficient documentation

## 2021-12-27 DIAGNOSIS — I700 Atherosclerosis of aorta: Secondary | ICD-10-CM | POA: Insufficient documentation

## 2021-12-27 DIAGNOSIS — J440 Chronic obstructive pulmonary disease with acute lower respiratory infection: Secondary | ICD-10-CM | POA: Diagnosis present

## 2021-12-27 DIAGNOSIS — E7800 Pure hypercholesterolemia, unspecified: Secondary | ICD-10-CM | POA: Diagnosis present

## 2021-12-27 DIAGNOSIS — F17210 Nicotine dependence, cigarettes, uncomplicated: Secondary | ICD-10-CM | POA: Diagnosis present

## 2021-12-27 DIAGNOSIS — I77819 Aortic ectasia, unspecified site: Secondary | ICD-10-CM | POA: Insufficient documentation

## 2021-12-27 DIAGNOSIS — A419 Sepsis, unspecified organism: Secondary | ICD-10-CM | POA: Diagnosis present

## 2021-12-27 DIAGNOSIS — R6520 Severe sepsis without septic shock: Secondary | ICD-10-CM | POA: Diagnosis present

## 2021-12-27 DIAGNOSIS — N179 Acute kidney failure, unspecified: Secondary | ICD-10-CM | POA: Diagnosis present

## 2021-12-27 DIAGNOSIS — Z8701 Personal history of pneumonia (recurrent): Secondary | ICD-10-CM | POA: Diagnosis not present

## 2021-12-27 DIAGNOSIS — J189 Pneumonia, unspecified organism: Secondary | ICD-10-CM | POA: Diagnosis present

## 2021-12-27 DIAGNOSIS — I10 Essential (primary) hypertension: Secondary | ICD-10-CM | POA: Diagnosis present

## 2021-12-27 DIAGNOSIS — Z1152 Encounter for screening for COVID-19: Secondary | ICD-10-CM | POA: Diagnosis not present

## 2021-12-27 LAB — CBC WITH DIFFERENTIAL/PLATELET
Abs Immature Granulocytes: 0.02 10*3/uL (ref 0.00–0.07)
Basophils Absolute: 0 10*3/uL (ref 0.0–0.1)
Basophils Relative: 0 %
Eosinophils Absolute: 0 10*3/uL (ref 0.0–0.5)
Eosinophils Relative: 0 %
HCT: 39 % (ref 36.0–46.0)
Hemoglobin: 12.1 g/dL (ref 12.0–15.0)
Immature Granulocytes: 0 %
Lymphocytes Relative: 9 %
Lymphs Abs: 0.8 10*3/uL (ref 0.7–4.0)
MCH: 28.3 pg (ref 26.0–34.0)
MCHC: 31 g/dL (ref 30.0–36.0)
MCV: 91.3 fL (ref 80.0–100.0)
Monocytes Absolute: 0.3 10*3/uL (ref 0.1–1.0)
Monocytes Relative: 3 %
Neutro Abs: 7.4 10*3/uL (ref 1.7–7.7)
Neutrophils Relative %: 88 %
Platelets: 189 10*3/uL (ref 150–400)
RBC: 4.27 MIL/uL (ref 3.87–5.11)
RDW: 14.3 % (ref 11.5–15.5)
WBC: 8.6 10*3/uL (ref 4.0–10.5)
nRBC: 0 % (ref 0.0–0.2)

## 2021-12-27 LAB — RESPIRATORY PANEL BY PCR

## 2021-12-27 LAB — URINALYSIS, COMPLETE (UACMP) WITH MICROSCOPIC
Bilirubin Urine: NEGATIVE
Glucose, UA: NEGATIVE mg/dL
Ketones, ur: NEGATIVE mg/dL
Nitrite: NEGATIVE
Protein, ur: 100 mg/dL — AB
Specific Gravity, Urine: 1.015 (ref 1.005–1.030)
pH: 5 (ref 5.0–8.0)

## 2021-12-27 LAB — HIV ANTIBODY (ROUTINE TESTING W REFLEX): HIV Screen 4th Generation wRfx: NONREACTIVE

## 2021-12-27 LAB — BASIC METABOLIC PANEL
Anion gap: 6 (ref 5–15)
BUN: 42 mg/dL — ABNORMAL HIGH (ref 8–23)
CO2: 24 mmol/L (ref 22–32)
Calcium: 8.7 mg/dL — ABNORMAL LOW (ref 8.9–10.3)
Chloride: 113 mmol/L — ABNORMAL HIGH (ref 98–111)
Creatinine, Ser: 1.33 mg/dL — ABNORMAL HIGH (ref 0.44–1.00)
GFR, Estimated: 40 mL/min — ABNORMAL LOW (ref >60)
Glucose, Bld: 153 mg/dL — ABNORMAL HIGH (ref 70–99)
Potassium: 4.3 mmol/L (ref 3.5–5.1)
Sodium: 143 mmol/L (ref 135–145)

## 2021-12-27 LAB — EXPECTORATED SPUTUM ASSESSMENT W GRAM STAIN, RFLX TO RESP C

## 2021-12-27 LAB — STREP PNEUMONIAE URINARY ANTIGEN: Strep Pneumo Urinary Antigen: NEGATIVE

## 2021-12-27 MED ORDER — ONDANSETRON HCL 4 MG/2ML IJ SOLN: 4.0000 mg | Freq: Once | INTRAMUSCULAR | Status: AC

## 2021-12-27 MED ORDER — ONDANSETRON HCL 4 MG/2ML IJ SOLN
INTRAMUSCULAR | Status: AC
  Administered 2021-12-27: 4 mg via INTRAVENOUS
  Filled 2021-12-27: qty 2

## 2021-12-27 NOTE — Progress Notes (Addendum)
PROGRESS NOTE    Shelley Thomas   WNU:[id number] DOB: 1940/04/24  DOA: 12/26/2021 Date of Service: 12/27/21 PCP: Jaclyn Shaggy, MD     Brief Narrative / Hospital Course:  Shelley Thomas is a 81 y.o. female with medical history significant of hypertension, hyperlipidemia, who presents to the ED with complaints of shortness of breath, malaise, generalized weakness, body aches and fever with poor p.o. intake for approximately 1 week.  Several days PTA, she began to experience increased cough with sputum production. She went to the urgent care 11/20, at which time she was found to be hypoxic down to 87% on room air.  She was febrile at 102.2.  Due to this, she was sent to the ED. [name] states that she has a long-term history of frequent pneumonia.  She has never been diagnosed with COPD or asthma in the past.  She continues to smoke approximately half a pack per day.  11/20:  tachycardic up to 117 with blood pressure of 110/71.  She was saturating at 94% on 2 L of nasal cannula.  She was febrile at 102.2. VS + infection + AKI  = severe sepsis d/t pneumonia.  Initial work-up remarkable for creatinine of 1.73.CT chest with ill-defined opacities within both lungs suspicious for atypical infection.  Due to Requiring supplemental oxygen, [facility] contacted for admission. Continued ceftriaxone and azithromycin, supplemental O2, steroids, DuoNebs, IV fluids. Obtain CT chest, RVP, strep pneumo and legionalla Ag, sputum cx, renal US, follow CBC and BMP.  11/21: VSS - afebrile, no longer tachycardic/tachypneic, saturating well still on 4L [place] O2. Cr improved to 1.33. SEPSIS RESOLVED. CBC WNL. UA concerning for mod leuks and 21-50 WBC, UCx ordered. CT chest: "c/w multilobar pneumonia, likely atypical in etiology, and possibly MAI." No concerns on renal US. BCx pending.   Consultants:  none  Procedures: none      ASSESSMENT & PLAN:   Principal Problem:   Sepsis due to pneumonia (HCC) Active  Problems:   CAP (community acquired pneumonia)   COPD with acute exacerbation (HCC)   AKI (acute kidney injury) (HCC)   Hypertension   High cholesterol   Acute respiratory failure with hypoxia (HCC)   Left renal stone 8 mm   Aortic atherosclerosis (HCC)   Dilation of descending aorta (HCC) mild   Severe sepsis d/t CAP (community acquired pneumonia) CT chest (+)multifocal pneumonia concerning for atypical  COPD with acute exacerbation (HCC) Continue supplemental oxygen to maintain O2 saturation above 88 %. Wean as tolerated Continue ceftriaxone and azithromycin RVP pending Strep pneumo and Legionella urinary antigen pending Sputum cultures ordered Plan outpatient PFT S/p SoluMedrol, continue on po predisone DuoNeb q6h maintenance bronchodilators on discharge  AKI (acute kidney injury) (HCC) - improved  No prior BMPs in chart for comparison.   Most likely secondary to poor p.o. intake over the past 1 week. IV fluid resuscitation has resulted in improved Cr/GFR Renal ultrasound no concerns Follow BMP Avoid nephrotoxic agents, including holding home lisinopril  Hypertension Holding home lisinopril due to AKI  High cholesterol Continue home fenofibrate    DVT prophylaxis: enoxaparin Pertinent IV fluids/nutrition: on LR at 100 mL/h, this was d/c now that sepsis parameters resolved and AKI improved  Central lines / invasive devices: none  Code Status: FULL CODE   Disposition: obs, need to change to inpatient  TOC needs: pending Barriers to discharge / significant pending items: still on O2 and requiring IV abx pending cultures  Subjective:  Patient reports feeling better but still coughing a lot Denies CP/SOB.  Pain controlled.  Denies new weakness.  Tolerating diet.  Reports no concerns w/ urination/defecation.   Family Communication: family at bedside on rounds     Objective Findings:  Vitals:   12/27/21 0800 12/27/21 0900 12/27/21  1100 12/27/21 1207  BP: 129/78 124/72  (!) 133/91  Pulse: 79 81  86  Resp: 16 20  20   Temp: 98.2 F (36.8 C)   98.7 F (37.1 C)  TempSrc: Oral   Oral  SpO2: 96% 99% 95% 98%  Weight:      Height:        Intake/Output Summary (Last 24 hours) at 12/27/2021 1420 Last data filed at 12/27/2021 0200 Gross per 24 hour  Intake 250 ml  Output --  Net 250 ml   Filed Weights   12/26/21 1645  Weight: 46.3 kg    Examination: Constitutional:  VS as above General Appearance: alert, well-developed, well-nourished, NAD Respiratory: Normal respiratory effort +diffuse wheeze +diffuse mild rhonchi/coarse breath sounds  No rales Cardiovascular: S1/S2 normal No murmur No rub/gallop auscultated No lower extremity edema Gastrointestinal: No tenderness Musculoskeletal:  Symmetrical movement in all extremities Neurological: No cranial nerve deficit on limited exam Alert Psychiatric: Normal judgment/insight Normal mood and affect       Scheduled Medications:   enoxaparin (LOVENOX) injection  30 mg Subcutaneous Q24H   fenofibrate  160 mg Oral Daily   predniSONE  40 mg Oral Q breakfast    Continuous Infusions:  azithromycin Stopped (12/27/21 0200)   cefTRIAXone (ROCEPHIN)  IV      PRN Medications:    Antimicrobials:  Anti-infectives (From admission, onward)    Start     Dose/Rate Route Frequency Ordered Stop   12/27/21 2000  cefTRIAXone (ROCEPHIN) 2 g in sodium chloride 0.9 % 100 mL IVPB        2 g 200 mL/hr over 30 Minutes Intravenous Every 24 hours 12/26/21 2044 12/31/21 1959   12/26/21 2200  azithromycin (ZITHROMAX) 500 mg in sodium chloride 0.9 % 250 mL IVPB        500 mg 250 mL/hr over 60 Minutes Intravenous Every 24 hours 12/26/21 2044 12/31/21 2159   12/26/21 1930  cefTRIAXone (ROCEPHIN) 1 g in sodium chloride 0.9 % 100 mL IVPB        1 g 200 mL/hr over 30 Minutes Intravenous  Once 12/26/21 1922 12/26/21 2042           Data Reviewed: I have  personally reviewed following labs and imaging studies  CBC: Recent Labs  Lab 12/26/21 1636 12/27/21 0224  WBC 8.8 8.6  NEUTROABS 7.0 7.4  HGB 12.8 12.1  HCT 39.0 39.0  MCV 89.9 91.3  PLT 189 [id number]   Basic Metabolic Panel: Recent Labs  Lab 12/26/21 1636 12/27/21 0224  NA 138 143  K 4.0 4.3  CL 104 113*  CO2 21* 24  GLUCOSE 117* 153*  BUN 43* 42*  CREATININE 1.73* 1.33*  CALCIUM 9.4 8.7*   GFR: Estimated Creatinine Clearance: 24.7 mL/min (A) (by C-G formula based on SCr of 1.33 mg/dL (H)). Liver Function Tests: No results for input(s): "AST", "ALT", "ALKPHOS", "BILITOT", "PROT", "ALBUMIN" in the last 168 hours. No results for input(s): "LIPASE", "AMYLASE" in the last 168 hours. No results for input(s): "AMMONIA" in the last 168 hours. Coagulation Profile: No results for input(s): "INR", "PROTIME" in the last 168 hours. Cardiac Enzymes: No results for input(s): "CKTOTAL", "CKMB", "CKMBINDEX", "  TROPONINI" in the last 168 hours. BNP (last 3 results) No results for input(s): "PROBNP" in the last 8760 hours. HbA1C: No results for input(s): "HGBA1C" in the last 72 hours. CBG: No results for input(s): "GLUCAP" in the last 168 hours. Lipid Profile: No results for input(s): "CHOL", "HDL", "LDLCALC", "TRIG", "CHOLHDL", "LDLDIRECT" in the last 72 hours. Thyroid Function Tests: No results for input(s): "TSH", "T4TOTAL", "FREET4", "T3FREE", "THYROIDAB" in the last 72 hours. Anemia Panel: No results for input(s): "VITAMINB12", "FOLATE", "FERRITIN", "TIBC", "IRON", "RETICCTPCT" in the last 72 hours. Most Recent Urinalysis On File:     Component Value Date/Time   COLORURINE YELLOW (A) 12/27/2021 0224   APPEARANCEUR CLOUDY (A) 12/27/2021 0224   LABSPEC 1.015 12/27/2021 0224   PHURINE 5.0 12/27/2021 0224   GLUCOSEU NEGATIVE 12/27/2021 0224   HGBUR SMALL (A) 12/27/2021 0224   BILIRUBINUR NEGATIVE 12/27/2021 0224   KETONESUR NEGATIVE 12/27/2021 0224   PROTEINUR 100 (A)  12/27/2021 0224   NITRITE NEGATIVE 12/27/2021 0224   LEUKOCYTESUR MODERATE (A) 12/27/2021 0224   Sepsis Labs: @LABRCNTIP (procalcitonin:4,lacticidven:4)  Recent Results (from the past 240 hour(s))  Resp panel by RT-PCR (RSV, Flu A&B, Covid) Anterior Nasal Swab     Status: None   Collection Time: 12/26/21  2:28 PM   Specimen: Anterior Nasal Swab  Result Value Ref Range Status   SARS Coronavirus 2 by RT PCR NEGATIVE NEGATIVE Final    Comment: (NOTE) SARS-CoV-2 target nucleic acids are NOT DETECTED.  The SARS-CoV-2 RNA is generally detectable in upper respiratory specimens during the acute phase of infection. The lowest concentration of SARS-CoV-2 viral copies this assay can detect is 138 copies/mL. A negative result does not preclude SARS-Cov-2 infection and should not be used as the sole basis for treatment or other patient management decisions. A negative result may occur with  improper specimen collection/handling, submission of specimen other than nasopharyngeal swab, presence of viral mutation(s) within the areas targeted by this assay, and inadequate number of viral copies(<138 copies/mL). A negative result must be combined with clinical observations, patient history, and epidemiological information. The expected result is Negative.  Fact Sheet for Patients:  EntrepreneurPulse.com.au  Fact Sheet for Healthcare Providers:  IncredibleEmployment.be  This test is no t yet approved or cleared by the [place] FDA and  has been authorized for detection and/or diagnosis of SARS-CoV-2 by FDA under an Emergency Use Authorization (EUA). This EUA will remain  in effect (meaning this test can be used) for the duration of the COVID-19 declaration under Section 564(b)(1) of the Act, 21 U.S.C.section 360bbb-3(b)(1), unless the authorization is terminated  or revoked sooner.       Influenza A by PCR NEGATIVE NEGATIVE Final   Influenza B by PCR  NEGATIVE NEGATIVE Final    Comment: (NOTE) The Xpert Xpress SARS-CoV-2/FLU/RSV plus assay is intended as an aid in the diagnosis of influenza from Nasopharyngeal swab specimens and should not be used as a sole basis for treatment. Nasal washings and aspirates are unacceptable for Xpert Xpress SARS-CoV-2/FLU/RSV testing.  Fact Sheet for Patients: EntrepreneurPulse.com.au  Fact Sheet for Healthcare Providers: IncredibleEmployment.be  This test is not yet approved or cleared by the [place] FDA and has been authorized for detection and/or diagnosis of SARS-CoV-2 by FDA under an Emergency Use Authorization (EUA). This EUA will remain in effect (meaning this test can be used) for the duration of the COVID-19 declaration under Section 564(b)(1) of the Act, 21 U.S.C. section 360bbb-3(b)(1), unless the authorization is terminated or revoked.  Resp Syncytial Virus by PCR NEGATIVE NEGATIVE Final    Comment: (NOTE) Fact Sheet for Patients: EntrepreneurPulse.com.au  Fact Sheet for Healthcare Providers: IncredibleEmployment.be  This test is not yet approved or cleared by the [place] FDA and has been authorized for detection and/or diagnosis of SARS-CoV-2 by FDA under an Emergency Use Authorization (EUA). This EUA will remain in effect (meaning this test can be used) for the duration of the COVID-19 declaration under Section 564(b)(1) of the Act, 21 U.S.C. section 360bbb-3(b)(1), unless the authorization is terminated or revoked.  Performed at [facility], [address], [place], [place] [address]   Resp Panel by RT-PCR (Flu A&B, Covid) Anterior Nasal Swab     Status: None   Collection Time: 12/26/21  4:34 PM   Specimen: Anterior Nasal Swab  Result Value Ref Range Status   SARS Coronavirus 2 by RT PCR NEGATIVE NEGATIVE Final    Comment: (NOTE) SARS-CoV-2 target nucleic acids are NOT  DETECTED.  The SARS-CoV-2 RNA is generally detectable in upper respiratory specimens during the acute phase of infection. The lowest concentration of SARS-CoV-2 viral copies this assay can detect is 138 copies/mL. A negative result does not preclude SARS-Cov-2 infection and should not be used as the sole basis for treatment or other patient management decisions. A negative result may occur with  improper specimen collection/handling, submission of specimen other than nasopharyngeal swab, presence of viral mutation(s) within the areas targeted by this assay, and inadequate number of viral copies(<138 copies/mL). A negative result must be combined with clinical observations, patient history, and epidemiological information. The expected result is Negative.  Fact Sheet for Patients:  EntrepreneurPulse.com.au  Fact Sheet for Healthcare Providers:  IncredibleEmployment.be  This test is no t yet approved or cleared by the [place] FDA and  has been authorized for detection and/or diagnosis of SARS-CoV-2 by FDA under an Emergency Use Authorization (EUA). This EUA will remain  in effect (meaning this test can be used) for the duration of the COVID-19 declaration under Section 564(b)(1) of the Act, 21 U.S.C.section 360bbb-3(b)(1), unless the authorization is terminated  or revoked sooner.       Influenza A by PCR NEGATIVE NEGATIVE Final   Influenza B by PCR NEGATIVE NEGATIVE Final    Comment: (NOTE) The Xpert Xpress SARS-CoV-2/FLU/RSV plus assay is intended as an aid in the diagnosis of influenza from Nasopharyngeal swab specimens and should not be used as a sole basis for treatment. Nasal washings and aspirates are unacceptable for Xpert Xpress SARS-CoV-2/FLU/RSV testing.  Fact Sheet for Patients: EntrepreneurPulse.com.au  Fact Sheet for Healthcare Providers: IncredibleEmployment.be  This test is not yet  approved or cleared by the [place] FDA and has been authorized for detection and/or diagnosis of SARS-CoV-2 by FDA under an Emergency Use Authorization (EUA). This EUA will remain in effect (meaning this test can be used) for the duration of the COVID-19 declaration under Section 564(b)(1) of the Act, 21 U.S.C. section 360bbb-3(b)(1), unless the authorization is terminated or revoked.  Performed at [facility], [address] [place], [place] [address]   Blood culture (routine x 2)     Status: None (Preliminary result)   Collection Time: 12/26/21  4:34 PM   Specimen: BLOOD  Result Value Ref Range Status   Specimen Description BLOOD RIGHT ANTECUBITAL  Final   Special Requests   Final    BOTTLES DRAWN AEROBIC AND ANAEROBIC Blood Culture adequate volume   Culture   Final    NO  GROWTH < 24 HOURS Performed at [facility], [address] [place], [place] [address]    Report Status PENDING  Incomplete  Blood culture (routine x 2)     Status: None (Preliminary result)   Collection Time: 12/26/21  4:42 PM   Specimen: BLOOD  Result Value Ref Range Status   Specimen Description BLOOD LEFT ANTECUBITAL  Final   Special Requests   Final    BOTTLES DRAWN AEROBIC AND ANAEROBIC Blood Culture results may not be optimal due to an excessive volume of blood received in culture bottles   Culture   Final    NO GROWTH < 12 HOURS Performed at [facility], [address] [place], [place] [address]    Report Status PENDING  Incomplete  Expectorated Sputum Assessment w Gram Stain, Rflx to Resp Cult     Status: None   Collection Time: 12/27/21 12:29 AM   Specimen: Sputum  Result Value Ref Range Status   Specimen Description SPU  Final   Special Requests NONE  Final   Sputum evaluation   Final    THIS SPECIMEN IS ACCEPTABLE FOR SPUTUM CULTURE Performed at [facility], [address], [place], [place] [address]    Report Status 12/27/2021 FINAL   Final  Culture, Respiratory w Gram Stain     Status: None (Preliminary result)   Collection Time: 12/27/21 12:29 AM   Specimen: Sputum  Result Value Ref Range Status   Specimen Description   Final    SPU Performed at [facility], [address] [place], [place] [address]    Special Requests   Final    NONE Reflexed from [id number] Performed at [facility], [address] [place], [place] [address]    Gram Stain   Final    FEW WBC PRESENT, PREDOMINANTLY PMN RARE GRAM POSITIVE COCCI Performed at [facility], [place] [address], [place], [place] [address]    Culture PENDING  Incomplete   Report Status PENDING  Incomplete         Radiology Studies: US RENAL  Result Date: 12/27/2021 CLINICAL DATA:  Acute kidney injury EXAM: RENAL / URINARY TRACT ULTRASOUND COMPLETE COMPARISON:  CT abdomen and pelvis 04/18/2017 FINDINGS: Right Kidney: Renal measurements: 10.7 x 4.6 x 4.6 cm = volume: 117 mL. Multiple small cysts, the largest 10 mm. Normal echotexture. No hydronephrosis. Left Kidney: Renal measurements: 10.7 x 4.6 x 4.5 cm = volume: 115 ML. Multiple cysts measuring up to 2.4 cm. 9 mm upper pole stone. No hydronephrosis. Normal echotexture. Bladder: Appears normal for degree of bladder distention. Other: None. IMPRESSION: No acute findings.  No hydronephrosis. Left nephrolithiasis. Electronically Signed   By: Rolm Baptise M.D.   On: 12/27/2021 02:31   CT CHEST WO CONTRAST  Result Date: 12/26/2021 CLINICAL DATA:  Pneumonia. EXAM: CT CHEST WITHOUT CONTRAST TECHNIQUE: Multidetector CT imaging of the chest was performed following the standard protocol without IV contrast. RADIATION DOSE REDUCTION: This exam was performed according to the departmental dose-optimization program which includes automated exposure control, adjustment of the mA and/or kV according to patient size and/or use of iterative reconstruction technique. COMPARISON:  Chest radiograph dated 12/26/2021  and CT dated 05/20/2020. FINDINGS: Evaluation of this exam is limited in the absence of intravenous contrast. Cardiovascular: There is no cardiomegaly or pericardial effusion. There is coronary vascular calcification. Moderate atherosclerotic calcification of the thoracic aorta. The thoracic aorta is slightly tortuous. Mild dilatation of the descending thoracic aorta measuring up  to 3.5 cm. The central pulmonary arteries are grossly unremarkable. Mediastinum/Nodes: No hilar or mediastinal adenopathy. The esophagus is grossly unremarkable. No mediastinal fluid collection. Lungs/Pleura: Bilateral scattered pulmonary nodules and patchy areas of ground-glass opacity predominantly involving the right middle lobe and lingula most consistent with multilobar pneumonia, likely atypical in etiology, and possibly MAI. Clinical correlation is recommended. Upper Abdomen: An 8 mm stone in the upper pole of the left kidney. Musculoskeletal: No acute osseous pathology. IMPRESSION: 1. Findings most consistent with multilobar pneumonia, likely atypical in etiology, and possibly MAI. 2. A 8 mm stone in the upper pole of the left kidney. 3. Mild dilatation of the descending thoracic aorta measuring up to 3.5 cm in diameter. 4.  Aortic Atherosclerosis (ICD10-I70.0). Electronically Signed   By: Anner Crete M.D.   On: 12/26/2021 23:47   DG Chest 2 View  Result Date: 12/26/2021 CLINICAL DATA:  Cough and fever. EXAM: CHEST - 2 VIEW COMPARISON:  Radiograph 06/12/2017, CT 05/20/2020 FINDINGS: The heart is normal in size. Aortic atherosclerosis. There are ill-defined opacities within both lungs suspicious for atypical infection. Background chronic hyperinflation and bronchial thickening. Lingular scarring. No pleural effusion or pneumothorax. No acute osseous findings. IMPRESSION: 1. Ill-defined opacities within both lungs suspicious for atypical infection. 2. Background chronic hyperinflation and bronchial thickening. Electronically  Signed   By: Keith Rake M.D.   On: 12/26/2021 17:20            LOS: 0 days      Emeterio Reeve, DO [facility] 12/27/2021, 2:20 PM    Dictation software may have been used to generate the above note. Typos may occur and escape review in typed/dictated notes. Please contact Dr Sheppard Coil directly for clarity if needed.  Staff may message me via secure chat in [place]  but this may not receive an immediate response,  please page me for urgent matters!  If 7PM-7AM, please contact night coverage [email or web address]

## 2021-12-27 NOTE — Hospital Course (Addendum)
Shelley Thomas is a 81 y.o. female with medical history significant of hypertension, hyperlipidemia, who presents to the ED with complaints of shortness of breath, malaise, generalized weakness, body aches and fever with poor p.o. intake for approximately 1 week.  Several days PTA, she began to experience increased cough with sputum production. She went to the urgent care 11/20, at which time she was found to be hypoxic down to 87% on room air.  She was febrile at 102.2.  Due to this, she was sent to the ED. Shelley Thomas states that she has a long-term history of frequent pneumonia.  She has never been diagnosed with COPD or asthma in the past.  She continues to smoke approximately half a pack per day.  11/20:  tachycardic up to 117 with blood pressure of 110/71.  She was saturating at 94% on 2 L of nasal cannula.  She was febrile at 102.2. VS + infection + AKI  = severe sepsis d/t pneumonia.  Initial work-up remarkable for creatinine of 1.73.CT chest with ill-defined opacities within both lungs suspicious for atypical infection.  Due to Requiring supplemental oxygen, [facility] contacted for admission. Continued ceftriaxone and azithromycin, supplemental O2, steroids, DuoNebs, IV fluids. Obtain CT chest, RVP, strep pneumo and legionalla Ag, sputum cx, renal US, follow CBC and BMP.  11/21: VSS - afebrile, no longer tachycardic/tachypneic, saturating well still on 4L [place] O2. Cr improved to 1.33. SEPSIS RESOLVED. CBC WNL. UA concerning for mod leuks and 21-50 WBC, UCx ordered. CT chest: "c/w multilobar pneumonia, likely atypical in etiology, and possibly MAI." No concerns on renal US. BCx pending.   Consultants:  none  Procedures: none      ASSESSMENT & PLAN:   Principal Problem:   Sepsis due to pneumonia (HCC) Active Problems:   CAP (community acquired pneumonia)   COPD with acute exacerbation (HCC)   AKI (acute kidney injury) (HCC)   Hypertension   High cholesterol   Acute respiratory failure with  hypoxia (HCC)   Left renal stone 8 mm   Aortic atherosclerosis (HCC)   Dilation of descending aorta (HCC) mild   Severe sepsis d/t CAP (community acquired pneumonia) CT chest (+)multifocal pneumonia concerning for atypical  COPD with acute exacerbation (HCC) Continue supplemental oxygen to maintain O2 saturation above 88 %. Wean as tolerated Continue ceftriaxone and azithromycin RVP pending Strep pneumo and Legionella urinary antigen pending Sputum cultures ordered Plan outpatient PFT S/p SoluMedrol, continue on po predisone DuoNeb q6h maintenance bronchodilators on discharge  AKI (acute kidney injury) (HCC) - improved  No prior BMPs in chart for comparison.   Most likely secondary to poor p.o. intake over the past 1 week. IV fluid resuscitation has resulted in improved Cr/GFR Renal ultrasound no concerns Follow BMP Avoid nephrotoxic agents, including holding home lisinopril  Hypertension Holding home lisinopril due to AKI  High cholesterol Continue home fenofibrate    DVT prophylaxis: enoxaparin Pertinent IV fluids/nutrition: on LR at 100 mL/h, this was d/c now that sepsis parameters resolved and AKI improved  Central lines / invasive devices: none  Code Status: FULL CODE   Disposition: obs, need to change to inpatient  TOC needs: pending Barriers to discharge / significant pending items: still on O2 and requiring IV abx pending cultures

## 2021-12-28 DIAGNOSIS — J189 Pneumonia, unspecified organism: Secondary | ICD-10-CM | POA: Diagnosis not present

## 2021-12-28 DIAGNOSIS — A419 Sepsis, unspecified organism: Secondary | ICD-10-CM | POA: Diagnosis not present

## 2021-12-28 LAB — BASIC METABOLIC PANEL
Anion gap: 7 (ref 5–15)
BUN: 47 mg/dL — ABNORMAL HIGH (ref 8–23)
CO2: 24 mmol/L (ref 22–32)
Calcium: 8.9 mg/dL (ref 8.9–10.3)
Chloride: 112 mmol/L — ABNORMAL HIGH (ref 98–111)
Creatinine, Ser: 1.16 mg/dL — ABNORMAL HIGH (ref 0.44–1.00)
GFR, Estimated: 48 mL/min — ABNORMAL LOW (ref >60)
Glucose, Bld: 132 mg/dL — ABNORMAL HIGH (ref 70–99)
Potassium: 4.5 mmol/L (ref 3.5–5.1)
Sodium: 143 mmol/L (ref 135–145)

## 2021-12-28 LAB — URINE CULTURE

## 2021-12-28 LAB — LEGIONELLA PNEUMOPHILA SEROGP 1 UR AG: L. pneumophila Serogp 1 Ur Ag: NEGATIVE

## 2021-12-28 MED ORDER — METHYLPREDNISOLONE SODIUM SUCC 125 MG IJ SOLR
80.0000 mg | INTRAMUSCULAR | Status: DC
  Administered 2021-12-28 – 2021-12-29 (×2): 80 mg via INTRAVENOUS
  Filled 2021-12-28 (×2): qty 2

## 2021-12-28 MED ORDER — ACETAMINOPHEN 325 MG PO TABS
650.0000 mg | ORAL_TABLET | Freq: Four times a day (QID) | ORAL | Status: DC | PRN
  Administered 2021-12-28 – 2021-12-29 (×2): 650 mg via ORAL
  Filled 2021-12-28 (×3): qty 2

## 2021-12-28 MED ORDER — NICOTINE 14 MG/24HR TD PT24
14.0000 mg | MEDICATED_PATCH | Freq: Every day | TRANSDERMAL | Status: DC
  Administered 2021-12-28 – 2021-12-29 (×2): 14 mg via TRANSDERMAL
  Filled 2021-12-28 (×2): qty 1

## 2021-12-28 MED ORDER — DM-GUAIFENESIN ER 30-600 MG PO TB12
1.0000 | ORAL_TABLET | Freq: Two times a day (BID) | ORAL | Status: DC
  Administered 2021-12-28 – 2021-12-29 (×3): 1 via ORAL
  Filled 2021-12-28 (×3): qty 1

## 2021-12-28 NOTE — Progress Notes (Signed)
Mobility Specialist - Progress Note   12/28/21 0838  Mobility  Activity Stood at bedside;Dangled on edge of bed;Ambulated with assistance in hallway  Level of Assistance Standby assist, set-up cues, supervision of patient - no hands on  Assistive Device None  Distance Ambulated (ft) 80 ft  Activity Response Tolerated well  Mobility Referral Yes  $Mobility charge 1 Mobility   Pt supine in bed on RA upon arrival. Pt completes bed mobility, STS, and ambulates in hallway indep. Pt desats to 80 SPO2 at end of ambulation. SPO2 rises back to resting level within 1 minute. RN notified. Pt left supine in bed with needs in reach.   Pre-mobility: HR 82 SpO2 91  During mobility: HR 90 SpO2 80 Post-mobility: HR, 80 SPO2 92  [facility]  Mobility Specialist  12/28/21 8:41 AM

## 2021-12-28 NOTE — Progress Notes (Signed)
PROGRESS NOTE  Shelley Thomas  ZOX:[id number] DOB: 12-16-1940 DOA: 12/26/2021 PCP: Jaclyn Shaggyate, Denny C, MD   Brief Narrative: Patient is a 81 year old female with history of hypertension, hyperlipidemia who presented here with shortness of breath, malaise, general weakness, body ache, fever, poor oral intake, increased sputum production.  At urgent care before coming here, she was found to be hypoxic and febrile.  Says history of frequent pneumonia.  Smokes cigarettes.  No history of COPD or asthma diagnosed yet.  On presentation, she was tachycardic, requiring 2 L of oxygen, febrile.  Labs showed creatinine of 1.7.  CT showed ill-defined opacities bilaterally suspicious for atypical infection.  Patient was admitted for the management of complex pneumonia and possible new onset COPD exacerbation  Assessment & Plan:  Principal Problem:   Sepsis due to pneumonia [facility](HCC) Active Problems:   CAP (community acquired pneumonia)   COPD with acute exacerbation (HCC)   AKI (acute kidney injury) (HCC)   Hypertension   High cholesterol   Acute respiratory failure with hypoxia (HCC)   Left renal stone 8 mm   Aortic atherosclerosis (HCC)   Dilation of descending aorta (HCC) mild  Acute hypoxic respiratory failure: Secondary to pneumonia, possible  COPD.  Not on oxygen at home.  Currently requiring 2 L.  Desaturates on ambulation.  We will again check her oxygenation on ambulation tomorrow morning, if she qualifies for oxygen, will send her home with oxygen. In the meantime we will try to wean and monitor on room air  Community-acquired pneumonia: Presented with 1 day history of fever, malaise, body ache, cough, shortness of breath.  Continue ceftriaxone azithromycin.  COVID, respiratory viral panel negative.  Blood cultures have been negative so far.  She has been afebrile.  Suspected COPD: No history of COPD but patient has bilateral expiratory wheezing, history of smoking.  Wheezing this morning.  We  will change prednisone to IV steroid.  Continue mucolytic's, incentive spirometer. She will need to pulmonology follow-up as an outpatient for PFT  AKI: Creatinine of 1.7 on presentation, improved with IV fluid.  Creatinine close to normal now.  Most likely this is from decreased oral intake.  Hypertension: Currently normotensive.  Takes lisinopril at home.  Currently on hold for AKI  Hyperlipidemia: On fenofibrate  Smoker: Has been smoking since last several years, a pack last for 4 days.  Counseled cessation          DVT prophylaxis:enoxaparin (LOVENOX) injection 30 mg Start: 12/26/21 2200     Code Status: Full Code  Family Communication: None at bedside  Patient status:Inpatient  Patient is from :Home  Anticipated discharge WJ:XBJYto:Home  Estimated DC date:tomorrow   Consultants: None  Procedures:None  Antimicrobials:  Anti-infectives (From admission, onward)    Start     Dose/Rate Route Frequency Ordered Stop   12/27/21 2000  cefTRIAXone (ROCEPHIN) 2 g in sodium chloride 0.9 % 100 mL IVPB        2 g 200 mL/hr over 30 Minutes Intravenous Every 24 hours 12/26/21 2044 12/31/21 1959   12/26/21 2200  azithromycin (ZITHROMAX) 500 mg in sodium chloride 0.9 % 250 mL IVPB        500 mg 250 mL/hr over 60 Minutes Intravenous Every 24 hours 12/26/21 2044 12/31/21 2159   12/26/21 1930  cefTRIAXone (ROCEPHIN) 1 g in sodium chloride 0.9 % 100 mL IVPB        1 g 200 mL/hr over 30 Minutes Intravenous  Once 12/26/21 1922 12/26/21 2042  Subjective: Patient seen and examined at bedside today.  Hemodynamically stable.  She ambulated this morning.  Did well but desaturated on walking.  During my evaluation, she was comfortably lying in bed.  On 2 L of oxygen.  She still has audible wheezing.  She is agreeable for staying 1 more night  Objective: Vitals:   12/27/21 1948 12/27/21 2122 12/28/21 0635 12/28/21 0700  BP: (!) 126/90  109/82 126/82  Pulse: 89  77 72  Resp: Temp: 98.8 F (37.1 C)  98.4 F (36.9 C) 97.8 F (36.6 C)  TempSrc:      SpO2: 98% 96% 97% 98%  Weight:      Height:        Intake/Output Summary (Last 24 hours) at 12/28/2021 1019 Last data filed at 12/28/2021 0400 Gross per 24 hour  Intake 350 ml  Output --  Net 350 ml   Filed Weights   12/26/21 1645  Weight: 46.3 kg    Examination:  General exam: Overall comfortable, not in distress, pleasant elderly female HEENT: PERRL Respiratory system: Bilateral expiratory wheezing  cardiovascular system: S1 & S2 heard, RRR.  Gastrointestinal system: Abdomen is nondistended, soft and nontender. Central nervous system: Alert and oriented Extremities: No edema, no clubbing ,no cyanosis Skin: No rashes, no ulcers,no icterus     Data Reviewed: I have personally reviewed following labs and imaging studies  CBC: Recent Labs  Lab 12/26/21 1636 12/27/21 0224  WBC 8.8 8.6  NEUTROABS 7.0 7.4  HGB 12.8 12.1  HCT 39.0 39.0  MCV 89.9 91.3  PLT 189 189   Basic Metabolic Panel: Recent Labs  Lab 12/26/21 1636 12/27/21 0224 12/28/21 0328  NA 138 143 143  K 4.0 4.3 4.5  CL 104 113* 112*  CO2 21* 24 24  GLUCOSE 117* 153* 132*  BUN 43* 42* 47*  CREATININE 1.73* 1.33* 1.16*  CALCIUM 9.4 8.7* 8.9     Recent Results (from the past 240 hour(s))  Resp panel by RT-PCR (RSV, Flu A&B, Covid) Anterior Nasal Swab     Status: None   Collection Time: 12/26/21  2:28 PM   Specimen: Anterior Nasal Swab  Result Value Ref Range Status   SARS Coronavirus 2 by RT PCR NEGATIVE NEGATIVE Final    Comment: (NOTE) SARS-CoV-2 target nucleic acids are NOT DETECTED.  The SARS-CoV-2 RNA is generally detectable in upper respiratory specimens during the acute phase of infection. The lowest concentration of SARS-CoV-2 viral copies this assay can detect is 138 copies/mL. A negative result does not preclude SARS-Cov-2 infection and should not be used as the sole basis for treatment or other  patient management decisions. A negative result may occur with  improper specimen collection/handling, submission of specimen other than nasopharyngeal swab, presence of viral mutation(s) within the areas targeted by this assay, and inadequate number of viral copies(<138 copies/mL). A negative result must be combined with clinical observations, patient history, and epidemiological information. The expected result is Negative.  Fact Sheet for Patients:  BloggerCourse.com  Fact Sheet for Healthcare Providers:  SeriousBroker.it  This test is no t yet approved or cleared by the [place] FDA and  has been authorized for detection and/or diagnosis of SARS-CoV-2 by FDA under an Emergency Use Authorization (EUA). This EUA will remain  in effect (meaning this test can be used) for the duration of the COVID-19 declaration under Section 564(b)(1) of the Act, 21 U.S.C.section 360bbb-3(b)(1), unless the authorization is terminated  or revoked  sooner.       Influenza A by PCR NEGATIVE NEGATIVE Final   Influenza B by PCR NEGATIVE NEGATIVE Final    Comment: (NOTE) The Xpert Xpress SARS-CoV-2/FLU/RSV plus assay is intended as an aid in the diagnosis of influenza from Nasopharyngeal swab specimens and should not be used as a sole basis for treatment. Nasal washings and aspirates are unacceptable for Xpert Xpress SARS-CoV-2/FLU/RSV testing.  Fact Sheet for Patients: BloggerCourse.com  Fact Sheet for Healthcare Providers: SeriousBroker.it  This test is not yet approved or cleared by the [place] FDA and has been authorized for detection and/or diagnosis of SARS-CoV-2 by FDA under an Emergency Use Authorization (EUA). This EUA will remain in effect (meaning this test can be used) for the duration of the COVID-19 declaration under Section 564(b)(1) of the Act, 21 U.S.C. section  360bbb-3(b)(1), unless the authorization is terminated or revoked.     Resp Syncytial Virus by PCR NEGATIVE NEGATIVE Final    Comment: (NOTE) Fact Sheet for Patients: BloggerCourse.com  Fact Sheet for Healthcare Providers: SeriousBroker.it  This test is not yet approved or cleared by the [place] FDA and has been authorized for detection and/or diagnosis of SARS-CoV-2 by FDA under an Emergency Use Authorization (EUA). This EUA will remain in effect (meaning this test can be used) for the duration of the COVID-19 declaration under Section 564(b)(1) of the Act, 21 U.S.C. section 360bbb-3(b)(1), unless the authorization is terminated or revoked.  Performed at [facility], [address], [place], [place] [address]   Resp Panel by RT-PCR (Flu A&B, Covid) Anterior Nasal Swab     Status: None   Collection Time: 12/26/21  4:34 PM   Specimen: Anterior Nasal Swab  Result Value Ref Range Status   SARS Coronavirus 2 by RT PCR NEGATIVE NEGATIVE Final    Comment: (NOTE) SARS-CoV-2 target nucleic acids are NOT DETECTED.  The SARS-CoV-2 RNA is generally detectable in upper respiratory specimens during the acute phase of infection. The lowest concentration of SARS-CoV-2 viral copies this assay can detect is 138 copies/mL. A negative result does not preclude SARS-Cov-2 infection and should not be used as the sole basis for treatment or other patient management decisions. A negative result may occur with  improper specimen collection/handling, submission of specimen other than nasopharyngeal swab, presence of viral mutation(s) within the areas targeted by this assay, and inadequate number of viral copies(<138 copies/mL). A negative result must be combined with clinical observations, patient history, and epidemiological information. The expected result is Negative.  Fact Sheet for Patients:   BloggerCourse.com  Fact Sheet for Healthcare Providers:  SeriousBroker.it  This test is no t yet approved or cleared by the [place] FDA and  has been authorized for detection and/or diagnosis of SARS-CoV-2 by FDA under an Emergency Use Authorization (EUA). This EUA will remain  in effect (meaning this test can be used) for the duration of the COVID-19 declaration under Section 564(b)(1) of the Act, 21 U.S.C.section 360bbb-3(b)(1), unless the authorization is terminated  or revoked sooner.       Influenza A by PCR NEGATIVE NEGATIVE Final   Influenza B by PCR NEGATIVE NEGATIVE Final    Comment: (NOTE) The Xpert Xpress SARS-CoV-2/FLU/RSV plus assay is intended as an aid in the diagnosis of influenza from Nasopharyngeal swab specimens and should not be used as a sole basis for treatment. Nasal washings and aspirates are unacceptable for Xpert Xpress SARS-CoV-2/FLU/RSV testing.  Fact Sheet for Patients: BloggerCourse.com  Fact Sheet for  Healthcare Providers: SeriousBroker.it  This test is not yet approved or cleared by the [place] and has been authorized for detection and/or diagnosis of SARS-CoV-2 by FDA under an Emergency Use Authorization (EUA). This EUA will remain in effect (meaning this test can be used) for the duration of the COVID-19 declaration under Section 564(b)(1) of the Act, 21 U.S.C. section 360bbb-3(b)(1), unless the authorization is terminated or revoked.  Performed at [facility], [address] [place], [place] [address]   Blood culture (routine x 2)     Status: None (Preliminary result)   Collection Time: 12/26/21  4:34 PM   Specimen: BLOOD  Result Value Ref Range Status   Specimen Description BLOOD RIGHT ANTECUBITAL  Final   Special Requests   Final    BOTTLES DRAWN AEROBIC AND ANAEROBIC Blood Culture adequate volume   Culture    Final    NO GROWTH 2 DAYS Performed at [facility], [address] [place], [place] [address]    Report Status PENDING  Incomplete  Blood culture (routine x 2)     Status: None (Preliminary result)   Collection Time: 12/26/21  4:42 PM   Specimen: BLOOD  Result Value Ref Range Status   Specimen Description BLOOD LEFT ANTECUBITAL  Final   Special Requests   Final    BOTTLES DRAWN AEROBIC AND ANAEROBIC Blood Culture results may not be optimal due to an excessive volume of blood received in culture bottles   Culture   Final    NO GROWTH 2 DAYS Performed at [facility], [address] [place], [place] [address]    Report Status PENDING  Incomplete  Expectorated Sputum Assessment w Gram Stain, Rflx to Resp Cult     Status: None   Collection Time: 12/27/21 12:29 AM   Specimen: Sputum  Result Value Ref Range Status   Specimen Description SPU  Final   Special Requests NONE  Final   Sputum evaluation   Final    THIS SPECIMEN IS ACCEPTABLE FOR SPUTUM CULTURE Performed at [facility], [address] [place], [place] [address]    Report Status 12/27/2021 FINAL  Final  Culture, Respiratory w Gram Stain     Status: None (Preliminary result)   Collection Time: 12/27/21 12:29 AM   Specimen: Sputum  Result Value Ref Range Status   Specimen Description   Final    SPU Performed at [facility], [address], [place], [place] [address]    Special Requests   Final    NONE Reflexed from [id number] Performed at [facility], [address] [place], [place] [address]    Gram Stain   Final    FEW WBC PRESENT, PREDOMINANTLY PMN RARE GRAM POSITIVE COCCI Performed at [facility], [address], [place], [place] [address]    Culture PENDING  Incomplete   Report Status PENDING  Incomplete  Urine Culture     Status: Abnormal   Collection Time: 12/27/21  2:24 AM   Specimen: Urine, Clean Catch  Result Value Ref Range Status   Specimen  Description   Final    URINE, CLEAN CATCH Performed at [facility], [address], [place], [place] [address]    Special Requests   Final    NONE Performed at [facility], [address], [place], [place] [address]    Culture MULTIPLE SPECIES PRESENT, SUGGEST RECOLLECTION (A)  Final   Report Status 12/28/2021 FINAL  Final  Respiratory (~20 pathogens) panel by PCR     Status: None   Collection Time: 12/27/21 10:28 AM   Specimen: Nasopharyngeal Swab; Respiratory  Result Value Ref Range Status   Adenovirus NOT DETECTED NOT DETECTED Final   Coronavirus 229E NOT DETECTED NOT DETECTED Final    Comment: (NOTE) The Coronavirus on the Respiratory Panel, DOES NOT test for the novel  Coronavirus (2019 nCoV)    Coronavirus HKU1 NOT DETECTED NOT DETECTED Final   Coronavirus NL63 NOT DETECTED NOT DETECTED Final   Coronavirus OC43 NOT DETECTED NOT DETECTED Final   Metapneumovirus NOT DETECTED NOT DETECTED Final   Rhinovirus / Enterovirus NOT DETECTED NOT DETECTED Final   Influenza A NOT DETECTED NOT DETECTED Final   Influenza B NOT DETECTED NOT DETECTED Final   Parainfluenza Virus 1 NOT DETECTED NOT DETECTED Final   Parainfluenza Virus 2 NOT DETECTED NOT DETECTED Final   Parainfluenza Virus 3 NOT DETECTED NOT DETECTED Final   Parainfluenza Virus 4 NOT DETECTED NOT DETECTED Final   Respiratory Syncytial Virus NOT DETECTED NOT DETECTED Final   Bordetella pertussis NOT DETECTED NOT DETECTED Final   Bordetella Parapertussis NOT DETECTED NOT DETECTED Final   Chlamydophila pneumoniae NOT DETECTED NOT DETECTED Final   Mycoplasma pneumoniae NOT DETECTED NOT DETECTED Final    Comment: Performed at [facility], [address], [place], [place] [address]     Radiology Studies: US RENAL  Result Date: 12/27/2021 CLINICAL DATA:  Acute kidney injury EXAM: RENAL / URINARY TRACT ULTRASOUND COMPLETE COMPARISON:  CT abdomen and pelvis 04/18/2017 FINDINGS: Right Kidney: Renal  measurements: 10.7 x 4.6 x 4.6 cm = volume: 117 mL. Multiple small cysts, the largest 10 mm. Normal echotexture. No hydronephrosis. Left Kidney: Renal measurements: 10.7 x 4.6 x 4.5 cm = volume: 115 ML. Multiple cysts measuring up to 2.4 cm. 9 mm upper pole stone. No hydronephrosis. Normal echotexture. Bladder: Appears normal for degree of bladder distention. Other: None. IMPRESSION: No acute findings.  No hydronephrosis. Left nephrolithiasis. Electronically Signed   By: Charlett Nose M.D.   On: 12/27/2021 02:31   CT CHEST WO CONTRAST  Result Date: 12/26/2021 CLINICAL DATA:  Pneumonia. EXAM: CT CHEST WITHOUT CONTRAST TECHNIQUE: Multidetector CT imaging of the chest was performed following the standard protocol without IV contrast. RADIATION DOSE REDUCTION: This exam was performed according to the departmental dose-optimization program which includes automated exposure control, adjustment of the mA and/or kV according to patient size and/or use of iterative reconstruction technique. COMPARISON:  Chest radiograph dated 12/26/2021 and CT dated 05/20/2020. FINDINGS: Evaluation of this exam is limited in the absence of intravenous contrast. Cardiovascular: There is no cardiomegaly or pericardial effusion. There is coronary vascular calcification. Moderate atherosclerotic calcification of the thoracic aorta. The thoracic aorta is slightly tortuous. Mild dilatation of the descending thoracic aorta measuring up to 3.5 cm. The central pulmonary arteries are grossly unremarkable. Mediastinum/Nodes: No hilar or mediastinal adenopathy. The esophagus is grossly unremarkable. No mediastinal fluid collection. Lungs/Pleura: Bilateral scattered pulmonary nodules and patchy areas of ground-glass opacity predominantly involving the right middle lobe and lingula most consistent with multilobar pneumonia, likely atypical in etiology, and possibly MAI. Clinical correlation is recommended. Upper Abdomen: An 8 mm stone in the upper  pole of the left kidney. Musculoskeletal: No acute osseous pathology. IMPRESSION: 1. Findings most consistent with multilobar pneumonia, likely atypical in etiology, and possibly MAI. 2. A 8 mm stone in the upper pole of the left kidney. 3. Mild dilatation of the descending thoracic  aorta measuring up to 3.5 cm in diameter. 4.  Aortic Atherosclerosis (ICD10-I70.0). Electronically Signed   By: Elgie Collard M.D.   On: 12/26/2021 23:47   DG Chest 2 View  Result Date: 12/26/2021 CLINICAL DATA:  Cough and fever. EXAM: CHEST - 2 VIEW COMPARISON:  Radiograph 06/12/2017, CT 05/20/2020 FINDINGS: The heart is normal in size. Aortic atherosclerosis. There are ill-defined opacities within both lungs suspicious for atypical infection. Background chronic hyperinflation and bronchial thickening. Lingular scarring. No pleural effusion or pneumothorax. No acute osseous findings. IMPRESSION: 1. Ill-defined opacities within both lungs suspicious for atypical infection. 2. Background chronic hyperinflation and bronchial thickening. Electronically Signed   By: Narda Rutherford M.D.   On: 12/26/2021 17:20    Scheduled Meds:  dextromethorphan-guaiFENesin  1 tablet Oral BID   enoxaparin (LOVENOX) injection  30 mg Subcutaneous Q24H   fenofibrate  160 mg Oral Daily   methylPREDNISolone (SOLU-MEDROL) injection  40 mg Intravenous Q12H   Continuous Infusions:  azithromycin 500 mg (12/27/21 2246)   cefTRIAXone (ROCEPHIN)  IV 2 g (12/27/21 2121)     LOS: 1 day   Burnadette Pop, MD [facility] P11/22/2023, 10:19 AM

## 2021-12-29 DIAGNOSIS — J189 Pneumonia, unspecified organism: Secondary | ICD-10-CM | POA: Diagnosis not present

## 2021-12-29 DIAGNOSIS — A419 Sepsis, unspecified organism: Secondary | ICD-10-CM | POA: Diagnosis not present

## 2021-12-29 LAB — CULTURE, RESPIRATORY W GRAM STAIN: Culture: NORMAL

## 2021-12-29 MED ORDER — NICOTINE 14 MG/24HR TD PT24: 14.0000 mg | MEDICATED_PATCH | Freq: Every day | TRANSDERMAL | 0 refills | Status: DC

## 2021-12-29 MED ORDER — AZITHROMYCIN 500 MG PO TABS: 500.0000 mg | ORAL_TABLET | Freq: Every day | ORAL | 0 refills | Status: AC

## 2021-12-29 MED ORDER — PREDNISONE 20 MG PO TABS: 40.0000 mg | ORAL_TABLET | Freq: Every day | ORAL | 0 refills | Status: AC

## 2021-12-29 MED ORDER — CEFDINIR 300 MG PO CAPS: 300.0000 mg | ORAL_CAPSULE | Freq: Two times a day (BID) | ORAL | 0 refills | Status: AC

## 2021-12-29 MED ORDER — LISINOPRIL 20 MG PO TABS
20.0000 mg | ORAL_TABLET | Freq: Two times a day (BID) | ORAL | Status: DC
  Administered 2021-12-29: 20 mg via ORAL
  Filled 2021-12-29: qty 1

## 2021-12-29 MED ORDER — HYDRALAZINE HCL 20 MG/ML IJ SOLN: 10.0000 mg | Freq: Four times a day (QID) | INTRAMUSCULAR | Status: DC | PRN

## 2021-12-29 MED ORDER — ALBUTEROL SULFATE HFA 108 (90 BASE) MCG/ACT IN AERS: 2.0000 | INHALATION_SPRAY | Freq: Four times a day (QID) | RESPIRATORY_TRACT | 2 refills | Status: DC | PRN

## 2021-12-29 MED ORDER — GUAIFENESIN ER 600 MG PO TB12: 600.0000 mg | ORAL_TABLET | Freq: Two times a day (BID) | ORAL | 0 refills | Status: AC

## 2021-12-29 NOTE — Progress Notes (Signed)
SATURATION QUALIFICATIONS: (This note is used to comply with regulatory documentation for home oxygen)  Patient Saturations on Room Air at Rest = 93 %  Patient Saturations on Room Air while Ambulating = 89-91%  Patient Saturations on 1  Liters of oxygen while Ambulating = 94%  Please briefly explain why patient needs home oxygen:

## 2021-12-29 NOTE — Discharge Summary (Signed)
Physician Discharge Summary  Shelley Thomas IRW:[id number] DOB: 01-24-1941 DOA: 12/26/2021  PCP: Jaclyn Shaggy, MD  Admit date: 12/26/2021 Discharge date: 12/29/2021  Admitted From: Home Disposition:  Home  Discharge Condition:Stable CODE STATUS:FULL Diet recommendation: Heart Healthy   Brief/Interim Summary:  Patient is a 81 year old female with history of hypertension, hyperlipidemia who presented here with shortness of breath, malaise, general weakness, body ache, fever, poor oral intake, increased sputum production.  At urgent care before coming here, she was found to be hypoxic and febrile.  Says history of frequent pneumonia.  Smokes cigarettes.  No history of COPD or asthma diagnosed yet.  On presentation, she was tachycardic, requiring 2 L of oxygen, febrile.  Labs showed creatinine of 1.7.  CT showed ill-defined opacities bilaterally suspicious for atypical infection.  Patient was admitted for the management of pneumonia and possible new onset COPD exacerbation .  Patient was treated with antibiotics, steroids.  Patient respiratory status has gradually improved.  She is medically stable for discharge home today with oral antibiotics, oral steroids.  We recommend to follow-up with pulmonology as an outpatient.  Following problems were addressed during her hospitalization:  Acute hypoxic respiratory failure: Secondary to pneumonia, possible  COPD.  Not on oxygen at home.   Desaturates on ambulation and required oxygen during this hospitalization but has been weaned off, not qualified for home oxygen   Community-acquired pneumonia: Presented with 1 day history of fever, malaise, body ache, cough, shortness of breath. Started on ceftriaxone azithromycin.  COVID, respiratory viral panel negative.  Blood cultures have been negative so far.  She has been afebrile.Abx changed to oral   Suspected COPD: No history of COPD but patient had bilateral expiratory wheezing, history of smoking.  She will need to pulmonology follow-up as an outpatient for PFT.She will be discharged on oral steroids   AKI: Creatinine of 1.7 on presentation, improved with IV fluid.  Creatinine close to normal now.  Most likely this is from decreased oral intake.   Hypertension:Takes lisinopril at home.Continue the same   Hyperlipidemia: On fenofibrate   Smoker: Has been smoking since last several years, a pack last for 4 days.  Counseled cessation.Nicotine patch ordered   Discharge Diagnoses:  Principal Problem:   Sepsis due to pneumonia [facility]) Active Problems:   CAP (community acquired pneumonia)   COPD with acute exacerbation (HCC)   AKI (acute kidney injury) (HCC)   Hypertension   High cholesterol   Acute respiratory failure with hypoxia (HCC)   Left renal stone 8 mm   Aortic atherosclerosis (HCC)   Dilation of descending aorta (HCC) mild    Discharge Instructions  Discharge Instructions     Diet - low sodium heart healthy   Complete by: As directed    Discharge instructions   Complete by: As directed    1)Please take prescribed medications as instructed 2)Follow up with your PCP in a week 3)Please stop smoking 4)Follow up with pulmonology for pulmonary function test as an outpatient.  Name and number the provider has been attached.  Call for appointment   Increase activity slowly   Complete by: As directed       Allergies as of 12/29/2021   No Known Allergies      Medication List     STOP taking these medications    oxyCODONE-acetaminophen 5-325 MG tablet Commonly known as: PERCOCET/ROXICET   traMADol 50 MG tablet Commonly known as: ULTRAM       TAKE these medications  albuterol 108 (90 Base) MCG/ACT inhaler Commonly known as: VENTOLIN HFA Inhale 2 puffs into the lungs every 6 (six) hours as needed for wheezing or shortness of breath.   azithromycin 500 MG tablet Commonly known as: Zithromax Take 1 tablet (500 mg total) by mouth daily for 2 days. Take  1 tablet daily for 3 days.   cefdinir 300 MG capsule Commonly known as: OMNICEF Take 1 capsule (300 mg total) by mouth 2 (two) times daily for 2 days.   fenofibrate 145 MG tablet Commonly known as: TRICOR Take 145 mg by mouth daily.   guaiFENesin 600 MG 12 hr tablet Commonly known as: Mucinex Take 1 tablet (600 mg total) by mouth 2 (two) times daily for 7 days.   lisinopril 20 MG tablet Commonly known as: ZESTRIL Take 20 mg by mouth 2 (two) times daily.   nicotine 14 mg/24hr patch Commonly known as: NICODERM CQ - dosed in mg/24 hours Place 1 patch (14 mg total) onto the skin daily. Start taking on: December 30, 2021   predniSONE 20 MG tablet Commonly known as: DELTASONE Take 2 tablets (40 mg total) by mouth daily for 5 days.        Follow-up Information     Jaclyn Shaggy, MD. Schedule an appointment as soon as possible for a visit in 1 week(s).   Specialty: Internal Medicine Contact information: [address]   [place] [address] [phone]         Vida Rigger, MD. Schedule an appointment as soon as possible for a visit in 2 week(s).   Specialty: Pulmonary Disease Contact information: [address] [place] [address] [phone]                No Known Allergies  Consultations: None   Procedures/Studies: US RENAL  Result Date: 12/27/2021 CLINICAL DATA:  Acute kidney injury EXAM: RENAL / URINARY TRACT ULTRASOUND COMPLETE COMPARISON:  CT abdomen and pelvis 04/18/2017 FINDINGS: Right Kidney: Renal measurements: 10.7 x 4.6 x 4.6 cm = volume: 117 mL. Multiple small cysts, the largest 10 mm. Normal echotexture. No hydronephrosis. Left Kidney: Renal measurements: 10.7 x 4.6 x 4.5 cm = volume: 115 ML. Multiple cysts measuring up to 2.4 cm. 9 mm upper pole stone. No hydronephrosis. Normal echotexture. Bladder: Appears normal for degree of bladder distention. Other: None. IMPRESSION: No acute findings.  No hydronephrosis. Left  nephrolithiasis. Electronically Signed   By: Charlett Nose M.D.   On: 12/27/2021 02:31   CT CHEST WO CONTRAST  Result Date: 12/26/2021 CLINICAL DATA:  Pneumonia. EXAM: CT CHEST WITHOUT CONTRAST TECHNIQUE: Multidetector CT imaging of the chest was performed following the standard protocol without IV contrast. RADIATION DOSE REDUCTION: This exam was performed according to the departmental dose-optimization program which includes automated exposure control, adjustment of the mA and/or kV according to patient size and/or use of iterative reconstruction technique. COMPARISON:  Chest radiograph dated 12/26/2021 and CT dated 05/20/2020. FINDINGS: Evaluation of this exam is limited in the absence of intravenous contrast. Cardiovascular: There is no cardiomegaly or pericardial effusion. There is coronary vascular calcification. Moderate atherosclerotic calcification of the thoracic aorta. The thoracic aorta is slightly tortuous. Mild dilatation of the descending thoracic aorta measuring up to 3.5 cm. The central pulmonary arteries are grossly unremarkable. Mediastinum/Nodes: No hilar or mediastinal adenopathy. The esophagus is grossly unremarkable. No mediastinal fluid collection. Lungs/Pleura: Bilateral scattered pulmonary nodules and patchy areas of ground-glass opacity predominantly involving the right middle lobe and lingula most consistent  with multilobar pneumonia, likely atypical in etiology, and possibly MAI. Clinical correlation is recommended. Upper Abdomen: An 8 mm stone in the upper pole of the left kidney. Musculoskeletal: No acute osseous pathology. IMPRESSION: 1. Findings most consistent with multilobar pneumonia, likely atypical in etiology, and possibly MAI. 2. A 8 mm stone in the upper pole of the left kidney. 3. Mild dilatation of the descending thoracic aorta measuring up to 3.5 cm in diameter. 4.  Aortic Atherosclerosis (ICD10-I70.0). Electronically Signed   By: Elgie Collard M.D.   On: 12/26/2021  23:47   DG Chest 2 View  Result Date: 12/26/2021 CLINICAL DATA:  Cough and fever. EXAM: CHEST - 2 VIEW COMPARISON:  Radiograph 06/12/2017, CT 05/20/2020 FINDINGS: The heart is normal in size. Aortic atherosclerosis. There are ill-defined opacities within both lungs suspicious for atypical infection. Background chronic hyperinflation and bronchial thickening. Lingular scarring. No pleural effusion or pneumothorax. No acute osseous findings. IMPRESSION: 1. Ill-defined opacities within both lungs suspicious for atypical infection. 2. Background chronic hyperinflation and bronchial thickening. Electronically Signed   By: Narda Rutherford M.D.   On: 12/26/2021 17:20      Subjective: Patient seen and examined at bedside today.  Hemodynamically stable for discharge today .discharge planning discussed with niece at bedside  Discharge Exam: Vitals:   12/29/21 0635 12/29/21 0806  BP: (!) 160/102 (!) 154/91  Pulse: 68 72  Resp:  19  Temp:  97.9 F (36.6 C)  SpO2: 98% 97%   Vitals:   12/29/21 0305 12/29/21 0529 12/29/21 0635 12/29/21 0806  BP: (!) 166/104 (!) 166/99 (!) 160/102 (!) 154/91  Pulse: 72 66 68 72  Resp:  16  19  Temp:  98 F (36.7 C)  97.9 F (36.6 C)  TempSrc:  Oral    SpO2:  98% 98% 97%  Weight:      Height:        General: Pt is alert, awake, not in acute distress Cardiovascular: RRR, S1/S2 +, no rubs, no gallops Respiratory: mild bilateral expiratory wheezing Abdominal: Soft, NT, ND, bowel sounds + Extremities: no edema, no cyanosis    The results of significant diagnostics from this hospitalization (including imaging, microbiology, ancillary and laboratory) are listed below for reference.     Microbiology: Recent Results (from the past 240 hour(s))  Resp panel by RT-PCR (RSV, Flu A&B, Covid) Anterior Nasal Swab     Status: None   Collection Time: 12/26/21  2:28 PM   Specimen: Anterior Nasal Swab  Result Value Ref Range Status   SARS Coronavirus 2 by RT PCR  NEGATIVE NEGATIVE Final    Comment: (NOTE) SARS-CoV-2 target nucleic acids are NOT DETECTED.  The SARS-CoV-2 RNA is generally detectable in upper respiratory specimens during the acute phase of infection. The lowest concentration of SARS-CoV-2 viral copies this assay can detect is 138 copies/mL. A negative result does not preclude SARS-Cov-2 infection and should not be used as the sole basis for treatment or other patient management decisions. A negative result may occur with  improper specimen collection/handling, submission of specimen other than nasopharyngeal swab, presence of viral mutation(s) within the areas targeted by this assay, and inadequate number of viral copies(<138 copies/mL). A negative result must be combined with clinical observations, patient history, and epidemiological information. The expected result is Negative.  Fact Sheet for Patients:  BloggerCourse.com  Fact Sheet for Healthcare Providers:  SeriousBroker.it  This test is no t yet approved or cleared by the [place] FDA and  has been  authorized for detection and/or diagnosis of SARS-CoV-2 by FDA under an Emergency Use Authorization (EUA). This EUA will remain  in effect (meaning this test can be used) for the duration of the COVID-19 declaration under Section 564(b)(1) of the Act, 21 U.S.C.section 360bbb-3(b)(1), unless the authorization is terminated  or revoked sooner.       Influenza A by PCR NEGATIVE NEGATIVE Final   Influenza B by PCR NEGATIVE NEGATIVE Final    Comment: (NOTE) The Xpert Xpress SARS-CoV-2/FLU/RSV plus assay is intended as an aid in the diagnosis of influenza from Nasopharyngeal swab specimens and should not be used as a sole basis for treatment. Nasal washings and aspirates are unacceptable for Xpert Xpress SARS-CoV-2/FLU/RSV testing.  Fact Sheet for Patients: BloggerCourse.comhttps://www.fda.gov/media/152166/download  Fact Sheet for  Healthcare Providers: SeriousBroker.ithttps://www.fda.gov/media/152162/download  This test is not yet approved or cleared by the [place] FDA and has been authorized for detection and/or diagnosis of SARS-CoV-2 by FDA under an Emergency Use Authorization (EUA). This EUA will remain in effect (meaning this test can be used) for the duration of the COVID-19 declaration under Section 564(b)(1) of the Act, 21 U.S.C. section 360bbb-3(b)(1), unless the authorization is terminated or revoked.     Resp Syncytial Virus by PCR NEGATIVE NEGATIVE Final    Comment: (NOTE) Fact Sheet for Patients: BloggerCourse.comhttps://www.fda.gov/media/152166/download  Fact Sheet for Healthcare Providers: SeriousBroker.ithttps://www.fda.gov/media/152162/download  This test is not yet approved or cleared by the [place] FDA and has been authorized for detection and/or diagnosis of SARS-CoV-2 by FDA under an Emergency Use Authorization (EUA). This EUA will remain in effect (meaning this test can be used) for the duration of the COVID-19 declaration under Section 564(b)(1) of the Act, 21 U.S.C. section 360bbb-3(b)(1), unless the authorization is terminated or revoked.  Performed at [facility], [address] [place], [place] [address]   Resp Panel by RT-PCR (Flu A&B, Covid) Anterior Nasal Swab     Status: None   Collection Time: 12/26/21  4:34 PM   Specimen: Anterior Nasal Swab  Result Value Ref Range Status   SARS Coronavirus 2 by RT PCR NEGATIVE NEGATIVE Final    Comment: (NOTE) SARS-CoV-2 target nucleic acids are NOT DETECTED.  The SARS-CoV-2 RNA is generally detectable in upper respiratory specimens during the acute phase of infection. The lowest concentration of SARS-CoV-2 viral copies this assay can detect is 138 copies/mL. A negative result does not preclude SARS-Cov-2 infection and should not be used as the sole basis for treatment or other patient management decisions. A negative result may occur with  improper  specimen collection/handling, submission of specimen other than nasopharyngeal swab, presence of viral mutation(s) within the areas targeted by this assay, and inadequate number of viral copies(<138 copies/mL). A negative result must be combined with clinical observations, patient history, and epidemiological information. The expected result is Negative.  Fact Sheet for Patients:  BloggerCourse.comhttps://www.fda.gov/media/152166/download  Fact Sheet for Healthcare Providers:  SeriousBroker.ithttps://www.fda.gov/media/152162/download  This test is no t yet approved or cleared by the [place] FDA and  has been authorized for detection and/or diagnosis of SARS-CoV-2 by FDA under an Emergency Use Authorization (EUA). This EUA will remain  in effect (meaning this test can be used) for the duration of the COVID-19 declaration under Section 564(b)(1) of the Act, 21 U.S.C.section 360bbb-3(b)(1), unless the authorization is terminated  or revoked sooner.       Influenza A by PCR NEGATIVE NEGATIVE Final   Influenza B by PCR NEGATIVE NEGATIVE Final    Comment: (NOTE)  The Xpert Xpress SARS-CoV-2/FLU/RSV plus assay is intended as an aid in the diagnosis of influenza from Nasopharyngeal swab specimens and should not be used as a sole basis for treatment. Nasal washings and aspirates are unacceptable for Xpert Xpress SARS-CoV-2/FLU/RSV testing.  Fact Sheet for Patients: BloggerCourse.com  Fact Sheet for Healthcare Providers: SeriousBroker.it  This test is not yet approved or cleared by the [place] FDA and has been authorized for detection and/or diagnosis of SARS-CoV-2 by FDA under an Emergency Use Authorization (EUA). This EUA will remain in effect (meaning this test can be used) for the duration of the COVID-19 declaration under Section 564(b)(1) of the Act, 21 U.S.C. section 360bbb-3(b)(1), unless the authorization is terminated or revoked.  Performed at  [facility], [address] [place], [place] [address]   Blood culture (routine x 2)     Status: None (Preliminary result)   Collection Time: 12/26/21  4:34 PM   Specimen: BLOOD  Result Value Ref Range Status   Specimen Description BLOOD RIGHT ANTECUBITAL  Final   Special Requests   Final    BOTTLES DRAWN AEROBIC AND ANAEROBIC Blood Culture adequate volume   Culture   Final    NO GROWTH 3 DAYS Performed at [facility], [address] [place], [place] [address]    Report Status PENDING  Incomplete  Blood culture (routine x 2)     Status: None (Preliminary result)   Collection Time: 12/26/21  4:42 PM   Specimen: BLOOD  Result Value Ref Range Status   Specimen Description BLOOD LEFT ANTECUBITAL  Final   Special Requests   Final    BOTTLES DRAWN AEROBIC AND ANAEROBIC Blood Culture results may not be optimal due to an excessive volume of blood received in culture bottles   Culture   Final    NO GROWTH 3 DAYS Performed at [facility], [address], [place], [place] [address]    Report Status PENDING  Incomplete  Expectorated Sputum Assessment w Gram Stain, Rflx to Resp Cult     Status: None   Collection Time: 12/27/21 12:29 AM   Specimen: Sputum  Result Value Ref Range Status   Specimen Description SPU  Final   Special Requests NONE  Final   Sputum evaluation   Final    THIS SPECIMEN IS ACCEPTABLE FOR SPUTUM CULTURE Performed at [facility], [address] [place], [place] [address]    Report Status 12/27/2021 FINAL  Final  Culture, Respiratory w Gram Stain     Status: None (Preliminary result)   Collection Time: 12/27/21 12:29 AM   Specimen: Sputum  Result Value Ref Range Status   Specimen Description   Final    SPU Performed at [facility], [address], [place], [place] [address]    Special Requests   Final    NONE Reflexed from [id number] Performed at [facility], [address] [place], [place]  [address]    Gram Stain   Final    FEW WBC PRESENT, PREDOMINANTLY PMN RARE GRAM POSITIVE COCCI    Culture   Final    CULTURE REINCUBATED FOR BETTER GROWTH Performed at [facility], [address], [place], [place] [address]    Report Status PENDING  Incomplete  Urine Culture     Status: Abnormal   Collection Time: 12/27/21  2:24 AM   Specimen: Urine, Clean Catch  Result Value Ref Range Status   Specimen Description   Final  URINE, CLEAN CATCH Performed at [facility], [address] [place], [place] [address]    Special Requests   Final    NONE Performed at [facility], [address] [place], [place] [address]    Culture MULTIPLE SPECIES PRESENT, SUGGEST RECOLLECTION (A)  Final   Report Status 12/28/2021 FINAL  Final  Respiratory (~20 pathogens) panel by PCR     Status: None   Collection Time: 12/27/21 10:28 AM   Specimen: Nasopharyngeal Swab; Respiratory  Result Value Ref Range Status   Adenovirus NOT DETECTED NOT DETECTED Final   Coronavirus 229E NOT DETECTED NOT DETECTED Final    Comment: (NOTE) The Coronavirus on the Respiratory Panel, DOES NOT test for the novel  Coronavirus (2019 nCoV)    Coronavirus HKU1 NOT DETECTED NOT DETECTED Final   Coronavirus NL63 NOT DETECTED NOT DETECTED Final   Coronavirus OC43 NOT DETECTED NOT DETECTED Final   Metapneumovirus NOT DETECTED NOT DETECTED Final   Rhinovirus / Enterovirus NOT DETECTED NOT DETECTED Final   Influenza A NOT DETECTED NOT DETECTED Final   Influenza B NOT DETECTED NOT DETECTED Final   Parainfluenza Virus 1 NOT DETECTED NOT DETECTED Final   Parainfluenza Virus 2 NOT DETECTED NOT DETECTED Final   Parainfluenza Virus 3 NOT DETECTED NOT DETECTED Final   Parainfluenza Virus 4 NOT DETECTED NOT DETECTED Final   Respiratory Syncytial Virus NOT DETECTED NOT DETECTED Final   Bordetella pertussis NOT DETECTED NOT DETECTED Final   Bordetella Parapertussis NOT DETECTED NOT DETECTED Final    Chlamydophila pneumoniae NOT DETECTED NOT DETECTED Final   Mycoplasma pneumoniae NOT DETECTED NOT DETECTED Final    Comment: Performed at [facility], [address] [place], [place] [address]     Labs: BNP (last 3 results) No results for input(s): "BNP" in the last 8760 hours. Basic Metabolic Panel: Recent Labs  Lab 12/26/21 1636 12/27/21 0224 12/28/21 0328  NA 138 143 143  K 4.0 4.3 4.5  CL 104 113* 112*  CO2 21* 24 24  GLUCOSE 117* 153* 132*  BUN 43* 42* 47*  CREATININE 1.73* 1.33* 1.16*  CALCIUM 9.4 8.7* 8.9   Liver Function Tests: No results for input(s): "AST", "ALT", "ALKPHOS", "BILITOT", "PROT", "ALBUMIN" in the last 168 hours. No results for input(s): "LIPASE", "AMYLASE" in the last 168 hours. No results for input(s): "AMMONIA" in the last 168 hours. CBC: Recent Labs  Lab 12/26/21 1636 12/27/21 0224  WBC 8.8 8.6  NEUTROABS 7.0 7.4  HGB 12.8 12.1  HCT 39.0 39.0  MCV 89.9 91.3  PLT 189 189   Cardiac Enzymes: No results for input(s): "CKTOTAL", "CKMB", "CKMBINDEX", "TROPONINI" in the last 168 hours. BNP: Invalid input(s): "POCBNP" CBG: No results for input(s): "GLUCAP" in the last 168 hours. D-Dimer No results for input(s): "DDIMER" in the last 72 hours. Hgb A1c No results for input(s): "HGBA1C" in the last 72 hours. Lipid Profile No results for input(s): "CHOL", "HDL", "LDLCALC", "TRIG", "CHOLHDL", "LDLDIRECT" in the last 72 hours. Thyroid function studies No results for input(s): "TSH", "T4TOTAL", "T3FREE", "THYROIDAB" in the last 72 hours.  Invalid input(s): "FREET3" Anemia work up No results for input(s): "VITAMINB12", "FOLATE", "FERRITIN", "TIBC", "IRON", "RETICCTPCT" in the last 72 hours. Urinalysis    Component Value Date/Time   COLORURINE YELLOW (A) 12/27/2021 0224   APPEARANCEUR CLOUDY (A) 12/27/2021 0224   LABSPEC 1.015 12/27/2021 0224   PHURINE 5.0 12/27/2021 0224   GLUCOSEU NEGATIVE 12/27/2021 0224   HGBUR SMALL (A) 12/27/2021  0224  BILIRUBINUR NEGATIVE 12/27/2021 0224   KETONESUR NEGATIVE 12/27/2021 0224   PROTEINUR 100 (A) 12/27/2021 0224   NITRITE NEGATIVE 12/27/2021 0224   LEUKOCYTESUR MODERATE (A) 12/27/2021 0224   Sepsis Labs Recent Labs  Lab 12/26/21 1636 12/27/21 0224  WBC 8.8 8.6   Microbiology Recent Results (from the past 240 hour(s))  Resp panel by RT-PCR (RSV, Flu A&B, Covid) Anterior Nasal Swab     Status: None   Collection Time: 12/26/21  2:28 PM   Specimen: Anterior Nasal Swab  Result Value Ref Range Status   SARS Coronavirus 2 by RT PCR NEGATIVE NEGATIVE Final    Comment: (NOTE) SARS-CoV-2 target nucleic acids are NOT DETECTED.  The SARS-CoV-2 RNA is generally detectable in upper respiratory specimens during the acute phase of infection. The lowest concentration of SARS-CoV-2 viral copies this assay can detect is 138 copies/mL. A negative result does not preclude SARS-Cov-2 infection and should not be used as the sole basis for treatment or other patient management decisions. A negative result may occur with  improper specimen collection/handling, submission of specimen other than nasopharyngeal swab, presence of viral mutation(s) within the areas targeted by this assay, and inadequate number of viral copies(<138 copies/mL). A negative result must be combined with clinical observations, patient history, and epidemiological information. The expected result is Negative.  Fact Sheet for Patients:  BloggerCourse.com  Fact Sheet for Healthcare Providers:  SeriousBroker.it  This test is no t yet approved or cleared by the [place] FDA and  has been authorized for detection and/or diagnosis of SARS-CoV-2 by FDA under an Emergency Use Authorization (EUA). This EUA will remain  in effect (meaning this test can be used) for the duration of the COVID-19 declaration under Section 564(b)(1) of the Act, 21 U.S.C.section  360bbb-3(b)(1), unless the authorization is terminated  or revoked sooner.       Influenza A by PCR NEGATIVE NEGATIVE Final   Influenza B by PCR NEGATIVE NEGATIVE Final    Comment: (NOTE) The Xpert Xpress SARS-CoV-2/FLU/RSV plus assay is intended as an aid in the diagnosis of influenza from Nasopharyngeal swab specimens and should not be used as a sole basis for treatment. Nasal washings and aspirates are unacceptable for Xpert Xpress SARS-CoV-2/FLU/RSV testing.  Fact Sheet for Patients: BloggerCourse.com  Fact Sheet for Healthcare Providers: SeriousBroker.it  This test is not yet approved or cleared by the [place] FDA and has been authorized for detection and/or diagnosis of SARS-CoV-2 by FDA under an Emergency Use Authorization (EUA). This EUA will remain in effect (meaning this test can be used) for the duration of the COVID-19 declaration under Section 564(b)(1) of the Act, 21 U.S.C. section 360bbb-3(b)(1), unless the authorization is terminated or revoked.     Resp Syncytial Virus by PCR NEGATIVE NEGATIVE Final    Comment: (NOTE) Fact Sheet for Patients: BloggerCourse.com  Fact Sheet for Healthcare Providers: SeriousBroker.it  This test is not yet approved or cleared by the [place] FDA and has been authorized for detection and/or diagnosis of SARS-CoV-2 by FDA under an Emergency Use Authorization (EUA). This EUA will remain in effect (meaning this test can be used) for the duration of the COVID-19 declaration under Section 564(b)(1) of the Act, 21 U.S.C. section 360bbb-3(b)(1), unless the authorization is terminated or revoked.  Performed at [facility], [address] [place], [place] [address]   Resp Panel by RT-PCR (Flu A&B, Covid) Anterior Nasal Swab     Status: None   Collection Time: 12/26/21  4:34  PM   Specimen: Anterior Nasal Swab   Result Value Ref Range Status   SARS Coronavirus 2 by RT PCR NEGATIVE NEGATIVE Final    Comment: (NOTE) SARS-CoV-2 target nucleic acids are NOT DETECTED.  The SARS-CoV-2 RNA is generally detectable in upper respiratory specimens during the acute phase of infection. The lowest concentration of SARS-CoV-2 viral copies this assay can detect is 138 copies/mL. A negative result does not preclude SARS-Cov-2 infection and should not be used as the sole basis for treatment or other patient management decisions. A negative result may occur with  improper specimen collection/handling, submission of specimen other than nasopharyngeal swab, presence of viral mutation(s) within the areas targeted by this assay, and inadequate number of viral copies(<138 copies/mL). A negative result must be combined with clinical observations, patient history, and epidemiological information. The expected result is Negative.  Fact Sheet for Patients:  BloggerCourse.com  Fact Sheet for Healthcare Providers:  SeriousBroker.it  This test is no t yet approved or cleared by the [place] FDA and  has been authorized for detection and/or diagnosis of SARS-CoV-2 by FDA under an Emergency Use Authorization (EUA). This EUA will remain  in effect (meaning this test can be used) for the duration of the COVID-19 declaration under Section 564(b)(1) of the Act, 21 U.S.C.section 360bbb-3(b)(1), unless the authorization is terminated  or revoked sooner.       Influenza A by PCR NEGATIVE NEGATIVE Final   Influenza B by PCR NEGATIVE NEGATIVE Final    Comment: (NOTE) The Xpert Xpress SARS-CoV-2/FLU/RSV plus assay is intended as an aid in the diagnosis of influenza from Nasopharyngeal swab specimens and should not be used as a sole basis for treatment. Nasal washings and aspirates are unacceptable for Xpert Xpress SARS-CoV-2/FLU/RSV testing.  Fact Sheet for  Patients: BloggerCourse.com  Fact Sheet for Healthcare Providers: SeriousBroker.it  This test is not yet approved or cleared by the [place] FDA and has been authorized for detection and/or diagnosis of SARS-CoV-2 by FDA under an Emergency Use Authorization (EUA). This EUA will remain in effect (meaning this test can be used) for the duration of the COVID-19 declaration under Section 564(b)(1) of the Act, 21 U.S.C. section 360bbb-3(b)(1), unless the authorization is terminated or revoked.  Performed at [facility], [address] [place], [place] [address]   Blood culture (routine x 2)     Status: None (Preliminary result)   Collection Time: 12/26/21  4:34 PM   Specimen: BLOOD  Result Value Ref Range Status   Specimen Description BLOOD RIGHT ANTECUBITAL  Final   Special Requests   Final    BOTTLES DRAWN AEROBIC AND ANAEROBIC Blood Culture adequate volume   Culture   Final    NO GROWTH 3 DAYS Performed at [facility], [address], [place], [place] [address]    Report Status PENDING  Incomplete  Blood culture (routine x 2)     Status: None (Preliminary result)   Collection Time: 12/26/21  4:42 PM   Specimen: BLOOD  Result Value Ref Range Status   Specimen Description BLOOD LEFT ANTECUBITAL  Final   Special Requests   Final    BOTTLES DRAWN AEROBIC AND ANAEROBIC Blood Culture results may not be optimal due to an excessive volume of blood received in culture bottles   Culture   Final    NO GROWTH 3 DAYS Performed at [facility], [address], [place], [place] [address]    Report Status PENDING  Incomplete  Expectorated  Sputum Assessment w Gram Stain, Rflx to Resp Cult     Status: None   Collection Time: 12/27/21 12:29 AM   Specimen: Sputum  Result Value Ref Range Status   Specimen Description SPU  Final   Special Requests NONE  Final   Sputum evaluation   Final    THIS SPECIMEN IS  ACCEPTABLE FOR SPUTUM CULTURE Performed at [facility], [address] [place], [place] [address]    Report Status 12/27/2021 FINAL  Final  Culture, Respiratory w Gram Stain     Status: None (Preliminary result)   Collection Time: 12/27/21 12:29 AM   Specimen: Sputum  Result Value Ref Range Status   Specimen Description   Final    SPU Performed at [facility], [address], [place], [place] [address]    Special Requests   Final    NONE Reflexed from [id number] Performed at [facility], [address] [place], [place] [address]    Gram Stain   Final    FEW WBC PRESENT, PREDOMINANTLY PMN RARE GRAM POSITIVE COCCI    Culture   Final    CULTURE REINCUBATED FOR BETTER GROWTH Performed at [facility], [address], [place], [place] [address]    Report Status PENDING  Incomplete  Urine Culture     Status: Abnormal   Collection Time: 12/27/21  2:24 AM   Specimen: Urine, Clean Catch  Result Value Ref Range Status   Specimen Description   Final    URINE, CLEAN CATCH Performed at [facility], [address] [place], [place] [address]    Special Requests   Final    NONE Performed at [facility], [address] [place], [place] [address]    Culture MULTIPLE SPECIES PRESENT, SUGGEST RECOLLECTION (A)  Final   Report Status 12/28/2021 FINAL  Final  Respiratory (~20 pathogens) panel by PCR     Status: None   Collection Time: 12/27/21 10:28 AM   Specimen: Nasopharyngeal Swab; Respiratory  Result Value Ref Range Status   Adenovirus NOT DETECTED NOT DETECTED Final   Coronavirus 229E NOT DETECTED NOT DETECTED Final    Comment: (NOTE) The Coronavirus on the Respiratory Panel, DOES NOT test for the novel  Coronavirus (2019 nCoV)    Coronavirus HKU1 NOT DETECTED NOT DETECTED Final   Coronavirus NL63 NOT DETECTED NOT DETECTED Final   Coronavirus OC43 NOT DETECTED NOT DETECTED Final   Metapneumovirus NOT DETECTED NOT DETECTED  Final   Rhinovirus / Enterovirus NOT DETECTED NOT DETECTED Final   Influenza A NOT DETECTED NOT DETECTED Final   Influenza B NOT DETECTED NOT DETECTED Final   Parainfluenza Virus 1 NOT DETECTED NOT DETECTED Final   Parainfluenza Virus 2 NOT DETECTED NOT DETECTED Final   Parainfluenza Virus 3 NOT DETECTED NOT DETECTED Final   Parainfluenza Virus 4 NOT DETECTED NOT DETECTED Final   Respiratory Syncytial Virus NOT DETECTED NOT DETECTED Final   Bordetella pertussis NOT DETECTED NOT DETECTED Final   Bordetella Parapertussis NOT DETECTED NOT DETECTED Final   Chlamydophila pneumoniae NOT DETECTED NOT DETECTED Final   Mycoplasma pneumoniae NOT DETECTED NOT DETECTED Final    Comment: Performed at [facility], [address] [place], [place] [address]    Please note: You were cared for by a hospitalist during your hospital stay. Once you are discharged, your primary care physician will handle any further medical issues. Please note that NO REFILLS for any discharge medications will be authorized  once you are discharged, as it is imperative that you return to your primary care physician (or establish a relationship with a primary care physician if you do not have one) for your post hospital discharge needs so that they can reassess your need for medications and monitor your lab values.    Time coordinating discharge: 40 minutes  SIGNED:   Burnadette Pop, MD  [facility] 12/29/2021, 10:43 AM Pager [phone]  If 7PM-7AM, please contact night-coverage [email or web address] Password [email or web address]

## 2021-12-29 NOTE — Progress Notes (Signed)
Patient being discharged home. Discharge instructions given, all questions answered. Patients IV removed. All belongings sent home with patient.

## 2021-12-31 LAB — CULTURE, BLOOD (ROUTINE X 2)
Culture: NO GROWTH
Culture: NO GROWTH
Special Requests: ADEQUATE

## 2022-01-16 ENCOUNTER — Other Ambulatory Visit: Payer: Self-pay | Admitting: Internal Medicine

## 2022-01-16 ENCOUNTER — Ambulatory Visit
Admission: RE | Admit: 2022-01-16 | Discharge: 2022-01-16 | Disposition: A | Discharge status: Home / Self Care | Payer: Medicare Other | Attending: Internal Medicine | Admitting: Internal Medicine

## 2022-01-16 ENCOUNTER — Ambulatory Visit
Admission: RE | Admit: 2022-01-16 | Discharge: 2022-01-16 | Disposition: A | Discharge status: Home / Self Care | Payer: Medicare Other | Source: Ambulatory Visit | Attending: Internal Medicine | Admitting: Internal Medicine

## 2022-01-16 DIAGNOSIS — J189 Pneumonia, unspecified organism: Secondary | ICD-10-CM | POA: Diagnosis not present

## 2022-09-12 ENCOUNTER — Encounter: Payer: Self-pay | Admitting: Ophthalmology

## 2022-09-16 ENCOUNTER — Emergency Department
Admission: EM | Admit: 2022-09-16 | Discharge: 2022-09-16 | Disposition: A | Discharge status: Home / Self Care | Payer: Medicare Other | Source: Home / Self Care | Attending: Emergency Medicine | Admitting: Emergency Medicine

## 2022-09-16 ENCOUNTER — Other Ambulatory Visit: Payer: Self-pay

## 2022-09-16 ENCOUNTER — Emergency Department: Payer: Medicare Other

## 2022-09-16 DIAGNOSIS — R0602 Shortness of breath: Secondary | ICD-10-CM | POA: Insufficient documentation

## 2022-09-16 DIAGNOSIS — R531 Weakness: Secondary | ICD-10-CM | POA: Diagnosis not present

## 2022-09-16 DIAGNOSIS — Z87891 Personal history of nicotine dependence: Secondary | ICD-10-CM | POA: Diagnosis not present

## 2022-09-16 DIAGNOSIS — R0609 Other forms of dyspnea: Secondary | ICD-10-CM

## 2022-09-16 DIAGNOSIS — R062 Wheezing: Secondary | ICD-10-CM

## 2022-09-16 DIAGNOSIS — Z1152 Encounter for screening for COVID-19: Secondary | ICD-10-CM | POA: Diagnosis not present

## 2022-09-16 LAB — COMPREHENSIVE METABOLIC PANEL
ALT: 14 U/L (ref 0–44)
AST: 22 U/L (ref 15–41)
Albumin: 3.7 g/dL (ref 3.5–5.0)
Alkaline Phosphatase: 26 U/L — ABNORMAL LOW (ref 38–126)
Anion gap: 8 (ref 5–15)
BUN: 25 mg/dL — ABNORMAL HIGH (ref 8–23)
CO2: 27 mmol/L (ref 22–32)
Calcium: 9.6 mg/dL (ref 8.9–10.3)
Chloride: 106 mmol/L (ref 98–111)
Creatinine, Ser: 0.99 mg/dL (ref 0.44–1.00)
GFR, Estimated: 58 mL/min — ABNORMAL LOW (ref >60)
Glucose, Bld: 157 mg/dL — ABNORMAL HIGH (ref 70–99)
Potassium: 4.1 mmol/L (ref 3.5–5.1)
Sodium: 141 mmol/L (ref 135–145)
Total Bilirubin: 0.5 mg/dL (ref 0.3–1.2)
Total Protein: 7.5 g/dL (ref 6.5–8.1)

## 2022-09-16 LAB — CBC
HCT: 42.8 % (ref 36.0–46.0)
Hemoglobin: 13.6 g/dL (ref 12.0–15.0)
MCH: 29.6 pg (ref 26.0–34.0)
MCHC: 31.8 g/dL (ref 30.0–36.0)
MCV: 93.2 fL (ref 80.0–100.0)
Platelets: 214 10*3/uL (ref 150–400)
RBC: 4.59 MIL/uL (ref 3.87–5.11)
RDW: 13.2 % (ref 11.5–15.5)
WBC: 5.4 10*3/uL (ref 4.0–10.5)
nRBC: 0 % (ref 0.0–0.2)

## 2022-09-16 LAB — RESP PANEL BY RT-PCR (RSV, FLU A&B, COVID)  RVPGX2
Influenza A by PCR: NEGATIVE
Influenza B by PCR: NEGATIVE
Resp Syncytial Virus by PCR: NEGATIVE
SARS Coronavirus 2 by RT PCR: NEGATIVE

## 2022-09-16 MED ORDER — BUDESONIDE-FORMOTEROL FUMARATE 80-4.5 MCG/ACT IN AERO: 2.0000 | INHALATION_SPRAY | Freq: Every day | RESPIRATORY_TRACT | 12 refills | Status: AC

## 2022-09-16 MED ORDER — IPRATROPIUM-ALBUTEROL 0.5-2.5 (3) MG/3ML IN SOLN
6.0000 mL | Freq: Once | RESPIRATORY_TRACT | Status: AC
  Administered 2022-09-16: 6 mL via RESPIRATORY_TRACT
  Filled 2022-09-16: qty 6

## 2022-09-16 MED ORDER — PREDNISONE 10 MG (21) PO TBPK: ORAL_TABLET | ORAL | 0 refills | Status: DC

## 2022-09-16 MED ORDER — PREDNISONE 20 MG PO TABS
50.0000 mg | ORAL_TABLET | Freq: Once | ORAL | Status: AC
  Administered 2022-09-16: 50 mg via ORAL
  Filled 2022-09-16: qty 1

## 2022-09-16 MED ORDER — ALBUTEROL SULFATE HFA 108 (90 BASE) MCG/ACT IN AERS: 2.0000 | INHALATION_SPRAY | Freq: Four times a day (QID) | RESPIRATORY_TRACT | 2 refills | Status: AC | PRN

## 2022-09-16 MED ORDER — IPRATROPIUM-ALBUTEROL 0.5-2.5 (3) MG/3ML IN SOLN: 3.0000 mL | Freq: Once | RESPIRATORY_TRACT | Status: DC

## 2022-09-16 NOTE — ED Provider Notes (Signed)
[facility] Provider Note   Event Date/Time   First MD Initiated Contact with Patient 09/16/22 1320     (approximate) History  No chief complaint on file.  HPI Shelley Thomas is a 82 y.o. female with a stated past medical history of tobacco abuse who presents complaining of for weakness with exertion.  Patient states that it is not shortness of breath per se but that she just becomes weak when she exerts herself.  Patient states that she is also having some blurry vision if she exerts herself too much as well.  Patient states that the symptoms including vision changes improve when she is at rest.  Triage had noted patient to have an oxygen saturation of 88% upon her first arrival and placed her on 2 L nasal cannula.  Patient is off supplemental oxygen at this time and shows no signs of respiratory distress with adequate oxygenation.  Patient states she does not hold any diagnosis of COPD or asthma despite a 40-year smoking history. ROS: Patient currently denies any tinnitus, difficulty speaking, facial droop, sore throat, chest pain, shortness of breath, abdominal pain, nausea/vomiting/diarrhea, dysuria, or numbness/paresthesias in any extremity   Physical Exam  Triage Vital Signs: ED Triage Vitals  Encounter Vitals Group     BP 09/16/22 1238 (!) 136/110     Systolic BP Percentile --      Diastolic BP Percentile --      Pulse --      Resp 09/16/22 1238 18     Temp 09/16/22 1238 98.8 F (37.1 C)     Temp src --      SpO2 09/16/22 1238 (!) 88 %     Weight --      Height --      Head Circumference --      Peak Flow --      Pain Score 09/16/22 1247 0     Pain Loc --      Pain Education --      Exclude from Growth Chart --    Most recent vital signs: Vitals:   09/16/22 1344 09/16/22 1419  BP:  (!) 152/86  Pulse: 81   Resp:    Temp:    SpO2: 95%    General: Awake, oriented x4. CV:  Good peripheral perfusion.  Resp:  Normal effort.  Inspiratory and  expiratory wheezing over bilateral lung fields Abd:  No distention.  Other:  Elderly well-developed, well-nourished Caucasian female laying in bed in no acute distress ED Results / Procedures / Treatments  Labs (all labs ordered are listed, but only abnormal results are displayed) Labs Reviewed  COMPREHENSIVE METABOLIC PANEL - Abnormal; Notable for the following components:      Result Value   Glucose, Bld 157 (*)    BUN 25 (*)    Alkaline Phosphatase 26 (*)    GFR, Estimated 58 (*)    All other components within normal limits  RESP PANEL BY RT-PCR (RSV, FLU A&B, COVID)  [id number]  CBC   EKG ED ECG REPORT I, Merwyn Katos, the attending physician, personally viewed and interpreted this ECG. Date: 09/16/2022 EKG Time: 1245 Rate: 96 Rhythm: normal sinus rhythm QRS Axis: normal Intervals: normal ST/T Wave abnormalities: normal Narrative Interpretation: no evidence of acute ischemia RADIOLOGY ED MD interpretation: One-view portable chest x-ray interpreted by me shows no evidence of acute abnormalities including no pneumonia, pneumothorax, or widened mediastinum -Agree with radiology assessment Official radiology report(s): DG Chest [facility]  1 View  Result Date: 09/16/2022 CLINICAL DATA:  Dyspnea EXAM: PORTABLE CHEST 1 VIEW COMPARISON:  01/16/2022 chest radiograph. FINDINGS: Stable cardiomediastinal silhouette with normal heart size. No pneumothorax. No pleural effusion. Chronic mild medial left lung base streaky scarring. No pulmonary edema. No acute consolidative airspace disease. IMPRESSION: No active disease. Electronically Signed   By: Delbert Phenix M.D.   On: 09/16/2022 13:29   PROCEDURES: Critical Care performed: No .1-3 Lead EKG Interpretation  Performed by: Merwyn Katos, MD Authorized by: Merwyn Katos, MD     Interpretation: normal     ECG rate:  71   ECG rate assessment: normal     Rhythm: sinus rhythm     Ectopy: none     Conduction: normal    MEDICATIONS ORDERED  IN ED: Medications  predniSONE (DELTASONE) tablet 50 mg (50 mg Oral Given 09/16/22 1352)  ipratropium-albuterol (DUONEB) 0.5-2.5 (3) MG/3ML nebulizer solution 6 mL (6 mLs Nebulization Given 09/16/22 1351)   IMPRESSION / MDM / ASSESSMENT AND PLAN / ED COURSE  I reviewed the triage vital signs and the nursing notes.                             The patient is on the cardiac monitor to evaluate for evidence of arrhythmia and/or significant heart rate changes. Patient's presentation is most consistent with acute presentation with potential threat to life or bodily function. 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  Based on the history, exam, CXR/EKG, and further workup I dont suspect any other emergent cause of this presentation, such as pneumonia, acute coronary syndrome, congestive heart failure, pulmonary embolism, or pneumothorax.  ED Interventions: bronchodilators, steroids, antibiotics, reassess  Reassessment: After treatment, the patients shortness of breath is resolved, and their lung exam has returned to baseline. They are comfortable and want to go home.  Rx: Steroids, Albuterol Disposition: Discharge home with SRP. PCP follow up recommended in next 48hours.   FINAL CLINICAL IMPRESSION(S) / ED DIAGNOSES   Final diagnoses:  Wheezing on auscultation  Generalized weakness  Dyspnea on exertion   Rx / DC Orders   ED Discharge Orders          Ordered    budesonide-formoterol (SYMBICORT) 80-4.5 MCG/ACT inhaler  Daily        09/16/22 1352    predniSONE (STERAPRED UNI-PAK 21 TAB) 10 MG (21) TBPK tablet        09/16/22 1352    albuterol (VENTOLIN HFA) 108 (90 Base) MCG/ACT inhaler  Every 6 hours PRN        09/16/22 1416           Note:  This document was prepared using  Dragon voice recognition software and may include unintentional dictation errors.   Merwyn Katos, MD 09/16/22 1434

## 2022-09-16 NOTE — ED Triage Notes (Signed)
Pt c/o tiredness and states she isn't dizzy but "can't focus" well. Pt is AOX4, NADnoted. Pt placed on 2 liter's Bishop in triage for hypoxia of 88%.

## 2022-09-18 NOTE — Discharge Instructions (Signed)

## 2022-09-19 NOTE — Anesthesia Preprocedure Evaluation (Addendum)
Anesthesia Evaluation  Patient identified by MRN, date of birth, ID band Patient awake    Reviewed: Allergy & Precautions, H&P , NPO status , Patient's Chart, lab work & pertinent test results  Airway Mallampati: III  TM Distance: >3 FB Neck ROM: Full    Dental no notable dental hx. (+) Upper Dentures, Lower Dentures   Pulmonary pneumonia, COPD, Current Smoker and Patient abstained from smoking.   Pulmonary exam normal breath sounds clear to auscultation       Cardiovascular hypertension, Normal cardiovascular exam Rhythm:Regular Rate:Normal     Neuro/Psych negative neurological ROS  negative psych ROS   GI/Hepatic negative GI ROS, Neg liver ROS,,,  Endo/Other  negative endocrine ROS    Renal/GU Renal diseasenegative Renal ROS  negative genitourinary   Musculoskeletal negative musculoskeletal ROS (+)    Abdominal   Peds negative pediatric ROS (+)  Hematology negative hematology ROS (+)   Anesthesia Other Findings Hypertension  High cholesterol Back pain  smoker  Reproductive/Obstetrics negative OB ROS                              Anesthesia Physical Anesthesia Plan  ASA: 3  Anesthesia Plan: MAC   Post-op Pain Management:    Induction: Intravenous  PONV Risk Score and Plan:   Airway Management Planned: Natural Airway and Nasal Cannula  Additional Equipment:   Intra-op Plan:   Post-operative Plan:   Informed Consent: I have reviewed the patients History and Physical, chart, labs and discussed the procedure including the risks, benefits and alternatives for the proposed anesthesia with the patient or authorized representative who has indicated his/her understanding and acceptance.     Dental Advisory Given  Plan Discussed with: Anesthesiologist, CRNA and Surgeon  Anesthesia Plan Comments: (Patient consented for risks of anesthesia including but not limited to:  -  adverse reactions to medications - damage to eyes, teeth, lips or other oral mucosa - nerve damage due to positioning  - sore throat or hoarseness - Damage to heart, brain, nerves, lungs, other parts of body or loss of life  Patient voiced understanding.)         Anesthesia Quick Evaluation

## 2022-09-27 ENCOUNTER — Ambulatory Visit
Admission: RE | Admit: 2022-09-27 | Discharge: 2022-09-27 | Disposition: A | Discharge status: Home / Self Care | Payer: Medicare Other | Attending: Ophthalmology | Admitting: Ophthalmology

## 2022-09-27 ENCOUNTER — Ambulatory Visit: Payer: Medicare Other | Admitting: Anesthesiology

## 2022-09-27 ENCOUNTER — Encounter: Payer: Self-pay | Admitting: Ophthalmology

## 2022-09-27 ENCOUNTER — Other Ambulatory Visit: Payer: Self-pay

## 2022-09-27 ENCOUNTER — Encounter
Admission: RE | Disposition: A | Discharge status: Home / Self Care | Payer: Self-pay | Source: Home / Self Care | Attending: Ophthalmology

## 2022-09-27 DIAGNOSIS — J449 Chronic obstructive pulmonary disease, unspecified: Secondary | ICD-10-CM | POA: Diagnosis not present

## 2022-09-27 DIAGNOSIS — F17210 Nicotine dependence, cigarettes, uncomplicated: Secondary | ICD-10-CM | POA: Insufficient documentation

## 2022-09-27 DIAGNOSIS — H2512 Age-related nuclear cataract, left eye: Secondary | ICD-10-CM | POA: Insufficient documentation

## 2022-09-27 DIAGNOSIS — I10 Essential (primary) hypertension: Secondary | ICD-10-CM | POA: Diagnosis not present

## 2022-09-27 HISTORY — PX: CATARACT EXTRACTION W/PHACO: SHX586

## 2022-09-27 SURGERY — PHACOEMULSIFICATION, CATARACT, WITH IOL INSERTION
Anesthesia: Monitor Anesthesia Care | Laterality: Left

## 2022-09-27 MED ORDER — SIGHTPATH DOSE#1 BSS IO SOLN
INTRAOCULAR | Status: DC | PRN
  Administered 2022-09-27: 64 mL via OPHTHALMIC

## 2022-09-27 MED ORDER — ARMC OPHTHALMIC DILATING DROPS
1.0000 | OPHTHALMIC | Status: DC | PRN
  Administered 2022-09-27 (×3): 1 via OPHTHALMIC

## 2022-09-27 MED ORDER — TETRACAINE HCL 0.5 % OP SOLN
1.0000 [drp] | OPHTHALMIC | Status: DC | PRN
  Administered 2022-09-27 (×3): 1 [drp] via OPHTHALMIC

## 2022-09-27 MED ORDER — SIGHTPATH DOSE#1 BSS IO SOLN
INTRAOCULAR | Status: DC | PRN
  Administered 2022-09-27: 2 mL

## 2022-09-27 MED ORDER — SIGHTPATH DOSE#1 BSS IO SOLN
INTRAOCULAR | Status: DC | PRN
  Administered 2022-09-27: 15 mL via INTRAOCULAR

## 2022-09-27 MED ORDER — LACTATED RINGERS IV SOLN: INTRAVENOUS | Status: DC

## 2022-09-27 MED ORDER — CEFUROXIME OPHTHALMIC INJECTION 1 MG/0.1 ML
INJECTION | OPHTHALMIC | Status: DC | PRN
  Administered 2022-09-27: 1 mg via INTRACAMERAL

## 2022-09-27 MED ORDER — SIGHTPATH DOSE#1 NA HYALUR & NA CHOND-NA HYALUR IO KIT
PACK | INTRAOCULAR | Status: DC | PRN
  Administered 2022-09-27: 1 via OPHTHALMIC

## 2022-09-27 MED ORDER — BRIMONIDINE TARTRATE-TIMOLOL 0.2-0.5 % OP SOLN
OPHTHALMIC | Status: DC | PRN
  Administered 2022-09-27: 1 [drp] via OPHTHALMIC

## 2022-09-27 MED ORDER — MIDAZOLAM HCL 2 MG/2ML IJ SOLN
INTRAMUSCULAR | Status: DC | PRN
  Administered 2022-09-27: 2 mg via INTRAVENOUS

## 2022-09-27 SURGICAL SUPPLY — 9 items
CATARACT SUITE SIGHTPATH (MISCELLANEOUS) ×1
FEE CATARACT SUITE SIGHTPATH (MISCELLANEOUS) ×1 IMPLANT
GLOVE SRG 8 PF TXTR STRL LF DI (GLOVE) ×1 IMPLANT
GLOVE SURG ENC TEXT LTX SZ7.5 (GLOVE) ×1 IMPLANT
GLOVE SURG UNDER POLY LF SZ8 (GLOVE) ×1
LENS IOL TECNIS EYHANCE 22.5 (Intraocular Lens) IMPLANT
NDL FILTER BLUNT 18X1 1/2 (NEEDLE) ×1 IMPLANT
NEEDLE FILTER BLUNT 18X1 1/2 (NEEDLE) ×1
SYR 3ML LL SCALE MARK (SYRINGE) ×1 IMPLANT

## 2022-09-27 NOTE — Anesthesia Postprocedure Evaluation (Signed)
Anesthesia Post Note  Patient: Shelley Thomas  Procedure(s) Performed: CATARACT EXTRACTION PHACO AND INTRAOCULAR LENS PLACEMENT (IOC) LEFT 3.90 00:33.1 (Left)  Patient location during evaluation: PACU Anesthesia Type: MAC Level of consciousness: awake and alert Pain management: pain level controlled Vital Signs Assessment: post-procedure vital signs reviewed and stable Respiratory status: spontaneous breathing, nonlabored ventilation, respiratory function stable and patient connected to nasal cannula oxygen Cardiovascular status: stable and blood pressure returned to baseline Postop Assessment: no apparent nausea or vomiting Anesthetic complications: no   No notable events documented.   Last Vitals:  Vitals:   09/27/22 1331 09/27/22 1335  BP: 108/85 (!) 125/91  Pulse: 78   Resp: 17   Temp: 36.6 C (!) 36.4 C  SpO2: 96% 95%    Last Pain:  Vitals:   09/27/22 1331  TempSrc:   PainSc: 0-No pain                 Shatoria Stooksbury C Raphel Stickles

## 2022-09-27 NOTE — Op Note (Signed)
OPERATIVE NOTE  Shelley Thomas [id number] 09/27/2022   PREOPERATIVE DIAGNOSIS:  Nuclear sclerotic cataract left eye. H25.12   POSTOPERATIVE DIAGNOSIS:    Nuclear sclerotic cataract left eye.     PROCEDURE:  Phacoemusification with posterior chamber intraocular lens placement of the left eye  Ultrasound time: Procedure(s): CATARACT EXTRACTION PHACO AND INTRAOCULAR LENS PLACEMENT (IOC) LEFT 3.90 00:33.1 (Left)  LENS:   Implant Name Type Inv. Item Serial No. Manufacturer Lot No. LRB No. Used Action  LENS IOL TECNIS EYHANCE 22.5 - [id number] Intraocular Lens LENS IOL TECNIS EYHANCE 22.5 [id number] [employer]  Left 1 Implanted      SURGEON:  Deirdre Evener, MD   ANESTHESIA:  Topical with tetracaine drops and 2% Xylocaine jelly, augmented with 1% preservative-free intracameral lidocaine.    COMPLICATIONS:  None.   DESCRIPTION OF PROCEDURE:  The patient was identified in the holding room and transported to the operating room and placed in the supine position under the operating microscope.  The left eye was identified as the operative eye and it was prepped and draped in the usual sterile ophthalmic fashion.   A 1 millimeter clear-corneal paracentesis was made at the 1:30 position.  0.5 ml of preservative-free 1% lidocaine was injected into the anterior chamber.  The anterior chamber was filled with Viscoat viscoelastic.  A 2.4 millimeter keratome was used to make a near-clear corneal incision at the 10:30 position.  .  A curvilinear capsulorrhexis was made with a cystotome and capsulorrhexis forceps.  Balanced salt solution was used to hydrodissect and hydrodelineate the nucleus.   Phacoemulsification was then used in stop and chop fashion to remove the lens nucleus and epinucleus.  The remaining cortex was then removed using the irrigation and aspiration handpiece. Provisc was then placed into the capsular bag to distend it for lens placement.  A lens was then injected into the  capsular bag.  The remaining viscoelastic was aspirated.   Wounds were hydrated with balanced salt solution.  The anterior chamber was inflated to a physiologic pressure with balanced salt solution.  No wound leaks were noted. Cefuroxime 0.1 ml of a 10mg /ml solution was injected into the anterior chamber for a dose of 1 mg of intracameral antibiotic at the completion of the case.   Timolol and Brimonidine drops were applied to the eye.  The patient was taken to the recovery room in stable condition without complications of anesthesia or surgery.  [name] 09/27/2022, 1:26 PM

## 2022-09-27 NOTE — H&P (Signed)
[facility]   Primary Care Physician:  Jaclyn Shaggy, MD Ophthalmologist: Dr. Lockie Mola  Pre-Procedure History & Physical: HPI:  Shelley Thomas is a 82 y.o. female here for ophthalmic surgery.   Past Medical History:  Diagnosis Date   Back pain    High cholesterol    Hypertension     Past Surgical History:  Procedure Laterality Date   ABDOMINAL HYSTERECTOMY     TUMOR REMOVAL      Prior to Admission medications   Medication Sig Start Date End Date Taking? Authorizing Provider  fenofibrate (TRICOR) 145 MG tablet Take 145 mg by mouth daily.   Yes [provider]  lisinopril (PRINIVIL,ZESTRIL) 20 MG tablet Take 20 mg by mouth 2 (two) times daily. 11/07/17  Yes [provider]  predniSONE (STERAPRED UNI-PAK 21 TAB) 10 MG (21) TBPK tablet As directed on packaging 09/16/22  Yes Bradler, Clent Jacks, MD  albuterol (VENTOLIN HFA) 108 (90 Base) MCG/ACT inhaler Inhale 2 puffs into the lungs every 6 (six) hours as needed for wheezing or shortness of breath. Patient not taking: Reported on 09/27/2022 09/16/22   Merwyn Katos, MD  budesonide-formoterol [facility]) 80-4.5 MCG/ACT inhaler Inhale 2 puffs into the lungs daily. Patient not taking: Reported on 09/27/2022 09/16/22   Merwyn Katos, MD  nicotine (NICODERM CQ - DOSED IN MG/24 HOURS) 14 mg/24hr patch Place 1 patch (14 mg total) onto the skin daily. Patient not taking: Reported on 09/12/2022 12/30/21   Burnadette Pop, MD    Allergies as of 08/28/2022   (No Known Allergies)    History reviewed. No pertinent family history.  Social History   Socioeconomic History   Marital status: Married    Spouse name: Not on file   Number of children: Not on file   Years of education: Not on file   Highest education level: Not on file  Occupational History   Not on file  Tobacco Use   Smoking status: Every Day    Current packs/day: 0.20    Average packs/day: 0.2 packs/day for 50.0 years (10.0 ttl pk-yrs)     Types: Cigarettes   Smokeless tobacco: Never  Vaping Use   Vaping status: Never Used  Substance and Sexual Activity   Alcohol use: No   Drug use: No   Sexual activity: Not on file  Other Topics Concern   Not on file  Social History Narrative   Not on file   Social Determinants of Health   Financial Resource Strain: Not on file  Food Insecurity: No Food Insecurity (12/27/2021)   Hunger Vital Sign    Worried About Running Out of Food in the Last Year: Never true    Ran Out of Food in the Last Year: Never true  Transportation Needs: No Transportation Needs (12/27/2021)   PRAPARE - Administrator, Civil Service (Medical): No    Lack of Transportation (Non-Medical): No  Physical Activity: Not on file  Stress: Not on file  Social Connections: Not on file  Intimate Partner Violence: Not At Risk (12/27/2021)   Humiliation, Afraid, Rape, and Kick questionnaire    Fear of Current or Ex-Partner: No    Emotionally Abused: No    Physically Abused: No    Sexually Abused: No    Review of Systems: See HPI, otherwise negative ROS  Physical Exam: BP (!) 122/90   Temp 98 F (36.7 C) (Temporal)   Resp 16   Ht 5\' 2"  (1.575 m)  Wt 48.4 kg   SpO2 96%   BMI 19.53 kg/m  General:   Alert,  pleasant and cooperative in NAD Head:  Normocephalic and atraumatic. Lungs:  Clear to auscultation.    Heart:  Regular rate and rhythm.   Impression/Plan: Shelley Thomas is here for ophthalmic surgery.  Risks, benefits, limitations, and alternatives regarding ophthalmic surgery have been reviewed with the patient.  Questions have been answered.  All parties agreeable.   Lockie Mola, MD  09/27/2022, 12:09 PM

## 2022-09-27 NOTE — Transfer of Care (Signed)
Immediate Anesthesia Transfer of Care Note  Patient: Shelley Thomas  Procedure(s) Performed: CATARACT EXTRACTION PHACO AND INTRAOCULAR LENS PLACEMENT (IOC) LEFT 3.90 00:33.1 (Left)  Patient Location: PACU  Anesthesia Type: MAC  Level of Consciousness: awake, alert  and patient cooperative  Airway and Oxygen Therapy: Patient Spontanous Breathing and Patient connected to supplemental oxygen  Post-op Assessment: Post-op Vital signs reviewed, Patient's Cardiovascular Status Stable, Respiratory Function Stable, Patent Airway and No signs of Nausea or vomiting  Post-op Vital Signs: Reviewed and stable  Complications: No notable events documented.

## 2022-09-28 ENCOUNTER — Encounter: Payer: Self-pay | Admitting: Ophthalmology

## 2022-10-02 NOTE — Anesthesia Preprocedure Evaluation (Addendum)
Anesthesia Evaluation  Patient identified by MRN, date of birth, ID band Patient awake    Reviewed: Allergy & Precautions, H&P , NPO status , Patient's Chart, lab work & pertinent test results  Airway Mallampati: III  TM Distance: >3 FB Neck ROM: Full    Dental no notable dental hx. (+) Edentulous Upper, Edentulous Lower   Pulmonary pneumonia, COPD, Current Smoker and Patient abstained from smoking.   Pulmonary exam normal breath sounds clear to auscultation       Cardiovascular hypertension, Normal cardiovascular exam Rhythm:Regular Rate:Normal     Neuro/Psych negative neurological ROS  negative psych ROS   GI/Hepatic negative GI ROS, Neg liver ROS,,,  Endo/Other  negative endocrine ROS    Renal/GU Renal disease  negative genitourinary   Musculoskeletal negative musculoskeletal ROS (+)    Abdominal   Peds negative pediatric ROS (+)  Hematology negative hematology ROS (+)   Anesthesia Other Findings HTN Hypercholesterolemia Chronic back pain  Previous cataract surgery 09-27-22  Reproductive/Obstetrics negative OB ROS                             Anesthesia Physical Anesthesia Plan  ASA: 3  Anesthesia Plan: MAC   Post-op Pain Management:    Induction: Intravenous  PONV Risk Score and Plan:   Airway Management Planned: Natural Airway and Nasal Cannula  Additional Equipment:   Intra-op Plan:   Post-operative Plan:   Informed Consent: I have reviewed the patients History and Physical, chart, labs and discussed the procedure including the risks, benefits and alternatives for the proposed anesthesia with the patient or authorized representative who has indicated his/her understanding and acceptance.     Dental Advisory Given  Plan Discussed with: Anesthesiologist, CRNA and Surgeon  Anesthesia Plan Comments: (Patient consented for risks of anesthesia including but not  limited to:  - adverse reactions to medications - damage to eyes, teeth, lips or other oral mucosa - nerve damage due to positioning  - sore throat or hoarseness - Damage to heart, brain, nerves, lungs, other parts of body or loss of life  Patient voiced understanding.)        Anesthesia Quick Evaluation

## 2022-10-06 NOTE — Discharge Instructions (Signed)

## 2022-10-11 ENCOUNTER — Ambulatory Visit
Admission: RE | Admit: 2022-10-11 | Discharge: 2022-10-11 | Disposition: A | Discharge status: Home / Self Care | Payer: Medicare Other | Attending: Ophthalmology | Admitting: Ophthalmology

## 2022-10-11 ENCOUNTER — Encounter: Payer: Self-pay | Admitting: Ophthalmology

## 2022-10-11 ENCOUNTER — Other Ambulatory Visit: Payer: Self-pay

## 2022-10-11 ENCOUNTER — Ambulatory Visit: Payer: Medicare Other | Admitting: Anesthesiology

## 2022-10-11 ENCOUNTER — Encounter
Admission: RE | Disposition: A | Discharge status: Home / Self Care | Payer: Self-pay | Source: Home / Self Care | Attending: Ophthalmology

## 2022-10-11 DIAGNOSIS — J449 Chronic obstructive pulmonary disease, unspecified: Secondary | ICD-10-CM | POA: Insufficient documentation

## 2022-10-11 DIAGNOSIS — I10 Essential (primary) hypertension: Secondary | ICD-10-CM | POA: Insufficient documentation

## 2022-10-11 DIAGNOSIS — Z7951 Long term (current) use of inhaled steroids: Secondary | ICD-10-CM | POA: Insufficient documentation

## 2022-10-11 DIAGNOSIS — H2511 Age-related nuclear cataract, right eye: Secondary | ICD-10-CM | POA: Insufficient documentation

## 2022-10-11 DIAGNOSIS — F17210 Nicotine dependence, cigarettes, uncomplicated: Secondary | ICD-10-CM | POA: Diagnosis not present

## 2022-10-11 HISTORY — PX: CATARACT EXTRACTION W/PHACO: SHX586

## 2022-10-11 SURGERY — PHACOEMULSIFICATION, CATARACT, WITH IOL INSERTION
Anesthesia: Monitor Anesthesia Care | Laterality: Right

## 2022-10-11 MED ORDER — SIGHTPATH DOSE#1 NA HYALUR & NA CHOND-NA HYALUR IO KIT
PACK | INTRAOCULAR | Status: DC | PRN
  Administered 2022-10-11: 1 via OPHTHALMIC

## 2022-10-11 MED ORDER — BRIMONIDINE TARTRATE-TIMOLOL 0.2-0.5 % OP SOLN
OPHTHALMIC | Status: DC | PRN
  Administered 2022-10-11: 1 [drp] via OPHTHALMIC

## 2022-10-11 MED ORDER — MIDAZOLAM HCL 2 MG/2ML IJ SOLN
INTRAMUSCULAR | Status: DC | PRN
  Administered 2022-10-11: 2 mg via INTRAVENOUS

## 2022-10-11 MED ORDER — SIGHTPATH DOSE#1 BSS IO SOLN
INTRAOCULAR | Status: DC | PRN
  Administered 2022-10-11: 15 mL via INTRAOCULAR

## 2022-10-11 MED ORDER — SIGHTPATH DOSE#1 BSS IO SOLN
INTRAOCULAR | Status: DC | PRN
  Administered 2022-10-11: 2 mL

## 2022-10-11 MED ORDER — SIGHTPATH DOSE#1 BSS IO SOLN
INTRAOCULAR | Status: DC | PRN
  Administered 2022-10-11: 76 mL via OPHTHALMIC

## 2022-10-11 MED ORDER — TETRACAINE HCL 0.5 % OP SOLN
1.0000 [drp] | OPHTHALMIC | Status: DC | PRN
  Administered 2022-10-11 (×3): 1 [drp] via OPHTHALMIC

## 2022-10-11 MED ORDER — LACTATED RINGERS IV SOLN: INTRAVENOUS | Status: DC

## 2022-10-11 MED ORDER — NEOMYCIN-BACITRACIN ZN-POLYMYX 5-400-10000 OP OINT
TOPICAL_OINTMENT | OPHTHALMIC | Status: DC | PRN
  Administered 2022-10-11: 1 via OPHTHALMIC

## 2022-10-11 MED ORDER — ARMC OPHTHALMIC DILATING DROPS
1.0000 | OPHTHALMIC | Status: DC | PRN
  Administered 2022-10-11 (×3): 1 via OPHTHALMIC

## 2022-10-11 SURGICAL SUPPLY — 9 items
CATARACT SUITE SIGHTPATH (MISCELLANEOUS) ×1
FEE CATARACT SUITE SIGHTPATH (MISCELLANEOUS) ×1 IMPLANT
GLOVE SRG 8 PF TXTR STRL LF DI (GLOVE) ×1 IMPLANT
GLOVE SURG ENC TEXT LTX SZ7.5 (GLOVE) ×1 IMPLANT
GLOVE SURG UNDER POLY LF SZ8 (GLOVE) ×1
LENS IOL TECNIS EYHANCE 22.5 (Intraocular Lens) IMPLANT
NDL FILTER BLUNT 18X1 1/2 (NEEDLE) ×1 IMPLANT
NEEDLE FILTER BLUNT 18X1 1/2 (NEEDLE) ×1
SYR 3ML LL SCALE MARK (SYRINGE) ×1 IMPLANT

## 2022-10-11 NOTE — H&P (Signed)
[facility]   Primary Care Physician:  Jaclyn Shaggy, MD Ophthalmologist: Dr. Lockie Mola  Pre-Procedure History & Physical: HPI:  Shelley Thomas is a 82 y.o. female here for ophthalmic surgery.   Past Medical History:  Diagnosis Date   Back pain    High cholesterol    Hypertension     Past Surgical History:  Procedure Laterality Date   ABDOMINAL HYSTERECTOMY     CATARACT EXTRACTION W/PHACO Left 09/27/2022   Procedure: CATARACT EXTRACTION PHACO AND INTRAOCULAR LENS PLACEMENT (IOC) LEFT 3.90 00:33.1;  Surgeon: Lockie Mola, MD;  Location: [facility];  Service: Ophthalmology;  Laterality: Left;   TUMOR REMOVAL      Prior to Admission medications   Medication Sig Start Date End Date Taking? Authorizing Provider  fenofibrate (TRICOR) 145 MG tablet Take 145 mg by mouth daily.   Yes [provider]  lisinopril (PRINIVIL,ZESTRIL) 20 MG tablet Take 20 mg by mouth 2 (two) times daily. 11/07/17  Yes [provider]  albuterol (VENTOLIN HFA) 108 (90 Base) MCG/ACT inhaler Inhale 2 puffs into the lungs every 6 (six) hours as needed for wheezing or shortness of breath. Patient not taking: Reported on 09/27/2022 09/16/22   Merwyn Katos, MD  budesonide-formoterol [facility]) 80-4.5 MCG/ACT inhaler Inhale 2 puffs into the lungs daily. Patient not taking: Reported on 09/27/2022 09/16/22   Merwyn Katos, MD  nicotine (NICODERM CQ - DOSED IN MG/24 HOURS) 14 mg/24hr patch Place 1 patch (14 mg total) onto the skin daily. Patient not taking: Reported on 09/12/2022 12/30/21   Burnadette Pop, MD  predniSONE (STERAPRED UNI-PAK 21 TAB) 10 MG (21) TBPK tablet As directed on packaging Patient not taking: Reported on 10/11/2022 09/16/22   Merwyn Katos, MD    Allergies as of 08/28/2022   (No Known Allergies)    History reviewed. No pertinent family history.  Social History   Socioeconomic History   Marital status: Married    Spouse name: Not on  file   Number of children: Not on file   Years of education: Not on file   Highest education level: Not on file  Occupational History   Not on file  Tobacco Use   Smoking status: Every Day    Current packs/day: 0.20    Average packs/day: 0.2 packs/day for 50.0 years (10.0 ttl pk-yrs)    Types: Cigarettes   Smokeless tobacco: Never  Vaping Use   Vaping status: Never Used  Substance and Sexual Activity   Alcohol use: No   Drug use: No   Sexual activity: Not on file  Other Topics Concern   Not on file  Social History Narrative   Not on file   Social Determinants of Health   Financial Resource Strain: Not on file  Food Insecurity: No Food Insecurity (12/27/2021)   Hunger Vital Sign    Worried About Running Out of Food in the Last Year: Never true    Ran Out of Food in the Last Year: Never true  Transportation Needs: No Transportation Needs (12/27/2021)   PRAPARE - Administrator, Civil Service (Medical): No    Lack of Transportation (Non-Medical): No  Physical Activity: Not on file  Stress: Not on file  Social Connections: Not on file  Intimate Partner Violence: Not At Risk (12/27/2021)   Humiliation, Afraid, Rape, and Kick questionnaire    Fear of Current or Ex-Partner: No    Emotionally Abused: No    Physically Abused: No  Sexually Abused: No    Review of Systems: See HPI, otherwise negative ROS  Physical Exam: BP (!) 180/96   Pulse 71   Temp 98.1 F (36.7 C) (Temporal)   Resp 15   Wt 48.5 kg   SpO2 95%   BMI 19.57 kg/m  General:   Alert,  pleasant and cooperative in NAD Head:  Normocephalic and atraumatic. Lungs:  Clear to auscultation.    Heart:  Regular rate and rhythm.   Impression/Plan: Shelley Thomas is here for ophthalmic surgery.  Risks, benefits, limitations, and alternatives regarding ophthalmic surgery have been reviewed with the patient.  Questions have been answered.  All parties agreeable.   Lockie Mola, MD   10/11/2022, 8:08 AM

## 2022-10-11 NOTE — Op Note (Signed)
LOCATION:  [facility]   PREOPERATIVE DIAGNOSIS:    Nuclear sclerotic cataract right eye. H25.11   POSTOPERATIVE DIAGNOSIS:  Nuclear sclerotic cataract right eye.     PROCEDURE:  Phacoemusification with posterior chamber intraocular lens placement of the right eye   ULTRASOUND TIME: Procedure(s): CATARACT EXTRACTION PHACO AND INTRAOCULAR LENS PLACEMENT (IOC) RIGHT 8.25 00:45.1 (Right)  LENS:   Implant Name Type Inv. Item Serial No. Manufacturer Lot No. LRB No. Used Action  LENS IOL TECNIS EYHANCE 22.5 - [id number] Intraocular Lens LENS IOL TECNIS EYHANCE 22.5 [id number] [employer]  Right 1 Implanted         SURGEON:  Deirdre Evener, MD   ANESTHESIA:  Topical with tetracaine drops and 2% Xylocaine jelly, augmented with 1% preservative-free intracameral lidocaine.    COMPLICATIONS:  None.   DESCRIPTION OF PROCEDURE:  The patient was identified in the holding room and transported to the operating room and placed in the supine position under the operating microscope.  The right eye was identified as the operative eye and it was prepped and draped in the usual sterile ophthalmic fashion.   A 1 millimeter clear-corneal paracentesis was made at the 12:00 position.  0.5 ml of preservative-free 1% lidocaine was injected into the anterior chamber. The anterior chamber was filled with Viscoat viscoelastic.  A 2.4 millimeter keratome was used to make a near-clear corneal incision at the 9:00 position.  A curvilinear capsulorrhexis was made with a cystotome and capsulorrhexis forceps.  Balanced salt solution was used to hydrodissect and hydrodelineate the nucleus.   Phacoemulsification was then used in stop and chop fashion to remove the lens nucleus and epinucleus.  The remaining cortex was then removed using the irrigation and aspiration handpiece. Provisc was then placed into the capsular bag to distend it for lens placement.  A lens was then injected into the capsular bag.  The  remaining viscoelastic was aspirated.   Wounds were hydrated with balanced salt solution.  The anterior chamber was inflated to a physiologic pressure with balanced salt solution.  No wound leaks were noted. Cefuroxime 0.1 ml of a 10mg /ml solution was injected into the anterior chamber for a dose of 1 mg of intracameral antibiotic at the completion of the case.   Timolol and Brimonidine drops and Maxitrol ointment were applied to the eye.  The patient was taken to the recovery room in stable condition without complications of anesthesia or surgery.   Seng Fouts 10/11/2022, 8:48 AM

## 2022-10-11 NOTE — Anesthesia Postprocedure Evaluation (Signed)
Anesthesia Post Note  Patient: Kalen Shelby  Procedure(s) Performed: CATARACT EXTRACTION PHACO AND INTRAOCULAR LENS PLACEMENT (IOC) RIGHT 8.25 00:45.1 (Right)  Patient location during evaluation: PACU Anesthesia Type: MAC Level of consciousness: awake and alert Pain management: pain level controlled Vital Signs Assessment: post-procedure vital signs reviewed and stable Respiratory status: spontaneous breathing, nonlabored ventilation, respiratory function stable and patient connected to nasal cannula oxygen Cardiovascular status: stable and blood pressure returned to baseline Postop Assessment: no apparent nausea or vomiting Anesthetic complications: no   No notable events documented.   Last Vitals:  Vitals:   10/11/22 0849 10/11/22 0854  BP: (!) 146/91 (!) 127/95  Pulse: 83 81  Resp: 18 (!) 24  Temp: (!) 36.1 C (!) 36.1 C  SpO2: 99% 98%    Last Pain:  Vitals:   10/11/22 0854  TempSrc:   PainSc: 0-No pain                 Johnchristopher Sarvis C Kailia Starry

## 2022-10-11 NOTE — Transfer of Care (Signed)
Immediate Anesthesia Transfer of Care Note  Patient: Shelley Thomas  Procedure(s) Performed: CATARACT EXTRACTION PHACO AND INTRAOCULAR LENS PLACEMENT (IOC) RIGHT 8.25 00:45.1 (Right)  Patient Location: PACU  Anesthesia Type: MAC  Level of Consciousness: awake, alert  and patient cooperative  Airway and Oxygen Therapy: Patient Spontanous Breathing and Patient connected to supplemental oxygen  Post-op Assessment: Post-op Vital signs reviewed, Patient's Cardiovascular Status Stable, Respiratory Function Stable, Patent Airway and No signs of Nausea or vomiting  Post-op Vital Signs: Reviewed and stable  Complications: No notable events documented.

## 2022-10-14 ENCOUNTER — Encounter: Payer: Self-pay | Admitting: Ophthalmology

## 2023-01-26 ENCOUNTER — Emergency Department: Payer: Medicare Other

## 2023-01-26 ENCOUNTER — Encounter: Payer: Self-pay | Admitting: Internal Medicine

## 2023-01-26 ENCOUNTER — Ambulatory Visit
Admission: EM | Admit: 2023-01-26 | Discharge: 2023-01-26 | Disposition: A | Discharge status: Other Acute Inpatient Hospital | Payer: Medicare Other | Attending: Physician Assistant | Admitting: Physician Assistant

## 2023-01-26 ENCOUNTER — Other Ambulatory Visit: Payer: Self-pay

## 2023-01-26 ENCOUNTER — Inpatient Hospital Stay
Admission: EM | Admit: 2023-01-26 | Discharge: 2023-01-30 | DRG: 871 | Disposition: A | Discharge status: Home / Self Care | Payer: Medicare Other | Attending: Internal Medicine | Admitting: Internal Medicine

## 2023-01-26 ENCOUNTER — Encounter: Payer: Self-pay | Admitting: Emergency Medicine

## 2023-01-26 DIAGNOSIS — R0602 Shortness of breath: Secondary | ICD-10-CM | POA: Diagnosis present

## 2023-01-26 DIAGNOSIS — N1831 Chronic kidney disease, stage 3a: Secondary | ICD-10-CM

## 2023-01-26 DIAGNOSIS — J441 Chronic obstructive pulmonary disease with (acute) exacerbation: Secondary | ICD-10-CM | POA: Diagnosis present

## 2023-01-26 DIAGNOSIS — R0902 Hypoxemia: Secondary | ICD-10-CM

## 2023-01-26 DIAGNOSIS — N183 Chronic kidney disease, stage 3 unspecified: Secondary | ICD-10-CM

## 2023-01-26 DIAGNOSIS — Z1152 Encounter for screening for COVID-19: Secondary | ICD-10-CM

## 2023-01-26 DIAGNOSIS — J9601 Acute respiratory failure with hypoxia: Secondary | ICD-10-CM | POA: Diagnosis present

## 2023-01-26 DIAGNOSIS — Z90710 Acquired absence of both cervix and uterus: Secondary | ICD-10-CM

## 2023-01-26 DIAGNOSIS — N182 Chronic kidney disease, stage 2 (mild): Secondary | ICD-10-CM | POA: Diagnosis present

## 2023-01-26 DIAGNOSIS — F17210 Nicotine dependence, cigarettes, uncomplicated: Secondary | ICD-10-CM | POA: Diagnosis present

## 2023-01-26 DIAGNOSIS — Z79899 Other long term (current) drug therapy: Secondary | ICD-10-CM | POA: Diagnosis not present

## 2023-01-26 DIAGNOSIS — E7800 Pure hypercholesterolemia, unspecified: Secondary | ICD-10-CM | POA: Diagnosis present

## 2023-01-26 DIAGNOSIS — A419 Sepsis, unspecified organism: Secondary | ICD-10-CM | POA: Diagnosis present

## 2023-01-26 DIAGNOSIS — I10 Essential (primary) hypertension: Secondary | ICD-10-CM | POA: Diagnosis present

## 2023-01-26 DIAGNOSIS — Z791 Long term (current) use of non-steroidal anti-inflammatories (NSAID): Secondary | ICD-10-CM

## 2023-01-26 DIAGNOSIS — I129 Hypertensive chronic kidney disease with stage 1 through stage 4 chronic kidney disease, or unspecified chronic kidney disease: Secondary | ICD-10-CM | POA: Diagnosis present

## 2023-01-26 DIAGNOSIS — J9621 Acute and chronic respiratory failure with hypoxia: Secondary | ICD-10-CM | POA: Diagnosis present

## 2023-01-26 DIAGNOSIS — J189 Pneumonia, unspecified organism: Secondary | ICD-10-CM | POA: Diagnosis present

## 2023-01-26 LAB — PROTIME-INR
INR: 1.1 (ref 0.8–1.2)
Prothrombin Time: 14.5 s (ref 11.4–15.2)

## 2023-01-26 LAB — RESPIRATORY PANEL BY PCR

## 2023-01-26 LAB — CBC
HCT: 38.6 % (ref 36.0–46.0)
Hemoglobin: 12.5 g/dL (ref 12.0–15.0)
MCH: 29.8 pg (ref 26.0–34.0)
MCHC: 32.4 g/dL (ref 30.0–36.0)
MCV: 91.9 fL (ref 80.0–100.0)
Platelets: 173 10*3/uL (ref 150–400)
RBC: 4.2 MIL/uL (ref 3.87–5.11)
RDW: 14.2 % (ref 11.5–15.5)
WBC: 12.1 10*3/uL — ABNORMAL HIGH (ref 4.0–10.5)
nRBC: 0 % (ref 0.0–0.2)

## 2023-01-26 LAB — APTT: aPTT: 33 s (ref 24–36)

## 2023-01-26 LAB — COMPREHENSIVE METABOLIC PANEL
ALT: 14 U/L (ref 0–44)
AST: 21 U/L (ref 15–41)
Albumin: 3.8 g/dL (ref 3.5–5.0)
Alkaline Phosphatase: 34 U/L — ABNORMAL LOW (ref 38–126)
Anion gap: 9 (ref 5–15)
BUN: 31 mg/dL — ABNORMAL HIGH (ref 8–23)
CO2: 27 mmol/L (ref 22–32)
Calcium: 9.3 mg/dL (ref 8.9–10.3)
Chloride: 100 mmol/L (ref 98–111)
Creatinine, Ser: 1.11 mg/dL — ABNORMAL HIGH (ref 0.44–1.00)
GFR, Estimated: 50 mL/min — ABNORMAL LOW (ref >60)
Glucose, Bld: 150 mg/dL — ABNORMAL HIGH (ref 70–99)
Potassium: 3.7 mmol/L (ref 3.5–5.1)
Sodium: 136 mmol/L (ref 135–145)
Total Bilirubin: 0.9 mg/dL (ref <1.2)
Total Protein: 7.7 g/dL (ref 6.5–8.1)

## 2023-01-26 LAB — PROCALCITONIN: Procalcitonin: 0.2 ng/mL

## 2023-01-26 LAB — RESP PANEL BY RT-PCR (RSV, FLU A&B, COVID)  RVPGX2
Influenza A by PCR: NEGATIVE
Influenza B by PCR: NEGATIVE
Resp Syncytial Virus by PCR: NEGATIVE
SARS Coronavirus 2 by RT PCR: NEGATIVE

## 2023-01-26 LAB — LACTIC ACID, PLASMA: Lactic Acid, Venous: 1.8 mmol/L (ref 0.5–1.9)

## 2023-01-26 MED ORDER — IPRATROPIUM-ALBUTEROL 0.5-2.5 (3) MG/3ML IN SOLN
3.0000 mL | Freq: Once | RESPIRATORY_TRACT | Status: AC
  Administered 2023-01-26: 3 mL via RESPIRATORY_TRACT

## 2023-01-26 MED ORDER — IPRATROPIUM-ALBUTEROL 0.5-2.5 (3) MG/3ML IN SOLN
3.0000 mL | Freq: Once | RESPIRATORY_TRACT | Status: AC
  Administered 2023-01-26: 3 mL via RESPIRATORY_TRACT
  Filled 2023-01-26: qty 3

## 2023-01-26 MED ORDER — PREDNISONE 20 MG PO TABS
40.0000 mg | ORAL_TABLET | Freq: Every day | ORAL | Status: DC
  Administered 2023-01-27 – 2023-01-30 (×4): 40 mg via ORAL
  Filled 2023-01-26 (×3): qty 2
  Filled 2023-01-26: qty 4

## 2023-01-26 MED ORDER — IPRATROPIUM-ALBUTEROL 0.5-2.5 (3) MG/3ML IN SOLN
3.0000 mL | Freq: Four times a day (QID) | RESPIRATORY_TRACT | Status: DC
Start: 2023-01-26 — End: 2023-01-28
  Administered 2023-01-26 – 2023-01-27 (×6): 3 mL via RESPIRATORY_TRACT
  Filled 2023-01-26 (×6): qty 3

## 2023-01-26 MED ORDER — SODIUM CHLORIDE 0.9 % IV BOLUS
1000.0000 mL | Freq: Once | INTRAVENOUS | Status: AC
  Administered 2023-01-26: 1000 mL via INTRAVENOUS

## 2023-01-26 MED ORDER — SODIUM CHLORIDE 0.9 % IV SOLN
500.0000 mg | INTRAVENOUS | Status: AC
Start: 2023-01-26 — End: 2023-01-31
  Administered 2023-01-26 – 2023-01-27 (×2): 500 mg via INTRAVENOUS
  Filled 2023-01-26 (×2): qty 5

## 2023-01-26 MED ORDER — ENOXAPARIN SODIUM 40 MG/0.4ML IJ SOSY
40.0000 mg | PREFILLED_SYRINGE | INTRAMUSCULAR | Status: DC
  Administered 2023-01-26 – 2023-01-29 (×4): 40 mg via SUBCUTANEOUS
  Filled 2023-01-26 (×4): qty 0.4

## 2023-01-26 MED ORDER — FENOFIBRATE 160 MG PO TABS
160.0000 mg | ORAL_TABLET | Freq: Every day | ORAL | Status: DC
  Administered 2023-01-27 – 2023-01-30 (×4): 160 mg via ORAL
  Filled 2023-01-26 (×4): qty 1

## 2023-01-26 MED ORDER — SODIUM CHLORIDE 0.9 % IV SOLN
2.0000 g | INTRAVENOUS | Status: AC
  Administered 2023-01-26 – 2023-01-30 (×5): 2 g via INTRAVENOUS
  Filled 2023-01-26 (×5): qty 20

## 2023-01-26 MED ORDER — METHYLPREDNISOLONE SODIUM SUCC 125 MG IJ SOLR
125.0000 mg | Freq: Once | INTRAMUSCULAR | Status: AC
  Administered 2023-01-26: 125 mg via INTRAVENOUS
  Filled 2023-01-26: qty 2

## 2023-01-26 MED ORDER — HYDROCOD POLI-CHLORPHE POLI ER 10-8 MG/5ML PO SUER: 5.0000 mL | Freq: Two times a day (BID) | ORAL | Status: DC | PRN

## 2023-01-26 MED ORDER — GUAIFENESIN ER 600 MG PO TB12
600.0000 mg | ORAL_TABLET | Freq: Two times a day (BID) | ORAL | Status: DC
Start: 2023-01-26 — End: 2023-01-30
  Administered 2023-01-26 – 2023-01-30 (×9): 600 mg via ORAL
  Filled 2023-01-26 (×9): qty 1

## 2023-01-26 NOTE — ED Notes (Signed)
Hospitalist at bedside.  Patient states neb treatment improved her breathing.

## 2023-01-26 NOTE — Assessment & Plan Note (Signed)
-   Hold home lisinopril and HCTZ while monitoring creatinine as noted above

## 2023-01-26 NOTE — ED Triage Notes (Signed)
Patient c/o cough and chest congestion that started 2 days ago.  Patient reports runny nose, bodyaches, and chills.  Patient unsure of fevers.

## 2023-01-26 NOTE — Assessment & Plan Note (Addendum)
Patient presenting with several days of productive cough and generalized malaise noted to have multifocal pneumonia seen on chest x-ray.  - Continue Ceftriaxone and azithromycin - RVP pending - Legionella and strep pneumo antigens pending - Tussionex as needed for cough - Guaifenesin twice daily

## 2023-01-26 NOTE — Assessment & Plan Note (Signed)
Significantly decreased breath sounds on examination hypoxia concerning for COPD exacerbation.  She has not had formal PFTs in the past, however significant smoking history and emphysematous changes seen on previous imaging.  - Continue supplemental oxygen to maintain oxygen saturation above 88% - Wean as tolerated - S/p Solu-Medrol 125 mg once - Start prednisone 40 mg tomorrow to complete a 5-day course - DuoNebs every 6 hours

## 2023-01-26 NOTE — Sepsis Progress Note (Signed)
Code Sepsis protocol being monitored by eLink. 

## 2023-01-26 NOTE — Progress Notes (Signed)
CODE SEPSIS - PHARMACY COMMUNICATION  **Broad Spectrum Antibiotics should be administered within 1 hour of Sepsis diagnosis**  Time Code Sepsis Called/Page Received: 1159  Antibiotics Ordered: ceftriaxone and azithromycin  Time of 1st antibiotic administration: 1235  Additional action taken by pharmacy: None  If necessary, Name of Provider/Nurse Contacted: None    Rockwell Alexandria ,PharmD Clinical Pharmacist  01/26/2023  12:27 PM

## 2023-01-26 NOTE — ED Triage Notes (Signed)
First nurse note: Pt to ED via [facility] from [facility]. Pt woke up with cough, productive green mucous. Went to UC due to getting worse. Pt sats 84% and given breathing tx PTA. Pt is on 3L [place] on 94%. Pt with hx COPD.    EMS VS:  Temp 101 120/90 HR 110

## 2023-01-26 NOTE — ED Provider Notes (Signed)
82 year old female with history of COPD presents for onset of fatigue, cough, congestion, chills, body aches, wheezing and shortness of breath yesterday.  Does not know if she has had a fever.  On triage oxygen is 82% on room air.  She is speaking 2-3 words at a time and pausing to take a breath.  Heart rate is 116 bpm and respiratory rate is increased.  She is in distress.  Wheezes noted throughout.  Nontoxic.    Explained to patient that she is hypoxic and will need to be taken to the ED for further evaluation.  Called EMS.  Patient placed on supplemental oxygen therapy and given DuoNeb treatment.  Oxygen increased mildly to 90% and started to drop again.  Report given to EMS.  Patient going to [facility] for further evaluation of acute respiratory failure.  Has a history of respiratory failure.   Shirlee Latch, PA-C 01/26/23 1153

## 2023-01-26 NOTE — ED Notes (Signed)
Patient is being discharged from the Urgent Care and sent to the [facility] Emergency Department via EMS . Per Eusebio Friendly, PA, patient is in need of higher level of care due to Hypoxia sat 85% on room air. Patient is aware and verbalizes understanding of plan of care.  Vitals:   01/26/23 1037 01/26/23 1043  BP: (!) 127/94   Pulse: (!) 116   Resp: (!) 24   Temp: 99.1 F (37.3 C)   SpO2: (!) 84% 92%

## 2023-01-26 NOTE — ED Triage Notes (Signed)
Pt here with SOB and a cough. Pt states she is coughing up yellow phlegm, pt endorses a fever as well. Pt was at [facility] today where she had 85% on RA, pt arrives on 3L [place].

## 2023-01-26 NOTE — H&P (Signed)
History and Physical    Patient: Shelley Thomas ZOX:[id number] DOB: 12/23/1940 DOA: 01/26/2023 DOS: the patient was seen and examined on 01/26/2023 PCP: Jaclyn Shaggy, MD  Patient coming from: Home  Chief Complaint:  Chief Complaint  Patient presents with   Shortness of Breath   HPI: Shelley Thomas is a 82 y.o. female with medical history significant of hypertension, hyperlipidemia, clinically diagnosed COPD, who presents to the ED due to shortness of breath.  Shelley Thomas states that she began to experience a cough with sputum production approximately 2-3 days ago which has significantly worsened and that is what brought her in today.  She also endorses rhinorrhea, body aches, generalized malaise with chills.  She denies any shortness of breath, fever, nausea, vomiting, diarrhea.  Patient initially went to an urgent care at which time she was noted to be saturating at 82% on room air with heart rate of 116.  She was then taken to the ED via EMS.  ED course: On arrival to the ED, patient was saturating at 92% on nonrebreather before being transitioned to 3 L of nasal cannula.  She was afebrile at 99.1.  She was hypertensive at 127/94 with heart rate of 116. Initial workup notable for WBC of 12.1, glucose 150, BUN 31, creat 1.11 with GFR 50.  Procalcitonin 0.20, lactic acid 1.8.  COVID-19, influenza and RSV PCR negative.  Chest x-ray concerning for multifocal pneumonia.  Patient started on azithromycin, ceftriaxone, Solu-Medrol, and DuoNebs.  [facility] contacted for admission.  Review of Systems: As mentioned in the history of present illness. All other systems reviewed and are negative.  Past Medical History:  Diagnosis Date   Back pain    High cholesterol    Hypertension    Past Surgical History:  Procedure Laterality Date   ABDOMINAL HYSTERECTOMY     CATARACT EXTRACTION W/PHACO Left 09/27/2022   Procedure: CATARACT EXTRACTION PHACO AND INTRAOCULAR LENS PLACEMENT (IOC) LEFT 3.90  00:33.1;  Surgeon: Lockie Mola, MD;  Location: [facility];  Service: Ophthalmology;  Laterality: Left;   CATARACT EXTRACTION W/PHACO Right 10/11/2022   Procedure: CATARACT EXTRACTION PHACO AND INTRAOCULAR LENS PLACEMENT (IOC) RIGHT 8.25 00:45.1;  Surgeon: Lockie Mola, MD;  Location: [facility];  Service: Ophthalmology;  Laterality: Right;   TUMOR REMOVAL     Social History:  reports that she has been smoking cigarettes. She has a 10 pack-year smoking history. She has never used smokeless tobacco. She reports that she does not drink alcohol and does not use drugs.  No Known Allergies  History reviewed. No pertinent family history.  Prior to Admission medications   Medication Sig Start Date End Date Taking? Authorizing Provider  fenofibrate (TRICOR) 145 MG tablet Take 145 mg by mouth daily.   Yes [provider]  hydrochlorothiazide (HYDRODIURIL) 12.5 MG tablet Take 12.5 mg by mouth every morning. 11/27/22  Yes [provider]  lisinopril (PRINIVIL,ZESTRIL) 20 MG tablet Take 20 mg by mouth 2 (two) times daily. 11/07/17  Yes [provider]  meclizine (ANTIVERT) 25 MG tablet Take 25 mg by mouth every 8 (eight) hours as needed. 12/07/22  Yes [provider]  meloxicam (MOBIC) 15 MG tablet Take 15 mg by mouth daily. 01/02/23  Yes [provider]  PREMARIN 0.3 MG tablet Take 1-2 tablets by mouth once a week. 01/05/23  Yes [provider]  albuterol (VENTOLIN HFA) 108 (90 Base) MCG/ACT inhaler Inhale 2 puffs into the lungs every 6 (six) hours as needed  for wheezing or shortness of breath. Patient not taking: Reported on 09/27/2022 09/16/22   Merwyn Katos, MD  budesonide-formoterol [facility]) 80-4.5 MCG/ACT inhaler Inhale 2 puffs into the lungs daily. Patient not taking: Reported on 09/27/2022 09/16/22   Merwyn Katos, MD  nicotine (NICODERM CQ - DOSED IN MG/24 HOURS) 14 mg/24hr patch Place 1 patch (14 mg total)  onto the skin daily. Patient not taking: Reported on 09/12/2022 12/30/21   Burnadette Pop, MD  predniSONE (STERAPRED UNI-PAK 21 TAB) 10 MG (21) TBPK tablet As directed on packaging Patient not taking: Reported on 10/11/2022 09/16/22   Merwyn Katos, MD    Physical Exam: Vitals:   01/26/23 1315 01/26/23 1330 01/26/23 1445 01/26/23 1500  BP:    (!) 143/86  Pulse: 95 96 90 92  Resp: 17 (!) 21 (!) 22 (!) 21  Temp:    98.8 F (37.1 C)  TempSrc:    Oral  SpO2: 97% 97% 96% 97%  Weight:      Height:       Physical Exam Vitals and nursing note reviewed.  Constitutional:      Appearance: She is normal weight.  HENT:     Head: Normocephalic and atraumatic.  Cardiovascular:     Rate and Rhythm: Normal rate and regular rhythm.     Heart sounds: No murmur heard. Pulmonary:     Effort: Tachypnea present. No accessory muscle usage or respiratory distress.     Breath sounds: Decreased breath sounds (Markedly decreased breath sounds throughout) present. No wheezing, rhonchi or rales.  Abdominal:     Palpations: Abdomen is soft.     Tenderness: There is no abdominal tenderness.  Musculoskeletal:     Right lower leg: No edema.     Left lower leg: No edema.  Skin:    General: Skin is warm and dry.  Neurological:     General: No focal deficit present.     Mental Status: She is alert and oriented to person, place, and time.  Psychiatric:        Mood and Affect: Mood normal.        Behavior: Behavior normal.    Data Reviewed: CBC with WBC of 12.1, hemoglobin of 12.5, platelets of 173 CMP with sodium of 136, potassium 3.7, bicarb 27, glucose 150, BUN 31, creatinine 1.11, alkaline phosphatase 34, AST 21, ALT 14, GFR 50 Lactic acid 1.8 Procalcitonin 0.20 INR 120, influenza and RSV PCR negative  EKG personally reviewed.  Sinus rhythm with rate of 112.  Single PVC noted.  No ST or T wave changes concerning for acute ischemia.  DG Chest 2 View Result Date: 01/26/2023 CLINICAL DATA:  Cough.   Chest congestion. EXAM: CHEST - 2 VIEW COMPARISON:  Chest radiograph dated September 16, 2022. FINDINGS: The heart size and mediastinal contours are within normal limits. Aortic atherosclerosis. Streaky right infrahilar and patchy left middle and lower lung zone opacities. No pleural effusion or pneumothorax. No acute osseous abnormality. IMPRESSION: Streaky right infrahilar and left middle and lower lung zone opacities may be secondary to multifocal pneumonia/atypical pneumonia. Electronically Signed   By: Hart Robinsons M.D.   On: 01/26/2023 13:41   Results are pending, will review when available.  Assessment and Plan:  * CAP (community acquired pneumonia) Patient presenting with several days of productive cough and generalized malaise noted to have multifocal pneumonia seen on chest x-ray.  - Continue Ceftriaxone and azithromycin - RVP pending - Legionella and strep pneumo antigens pending -  Tussionex as needed for cough - Guaifenesin twice daily  COPD with acute exacerbation (HCC) Significantly decreased breath sounds on examination hypoxia concerning for COPD exacerbation.  She has not had formal PFTs in the past, however significant smoking history and emphysematous changes seen on previous imaging.  - Continue supplemental oxygen to maintain oxygen saturation above 88% - Wean as tolerated - S/p Solu-Medrol 125 mg once - Start prednisone 40 mg tomorrow to complete a 5-day course - DuoNebs every 6 hours  CKD (chronic kidney disease), stage III (HCC) Current creatinine elevated at 1.11 previously 0.99.  On admission last year, patient's creatinine normalized to 1.16 prior to discharge.  Overall, most likely chronic renal disease rather than AKI.  - Continue to monitor while admitted.  Hypertension - Hold home lisinopril and HCTZ while monitoring creatinine as noted above  Advance Care Planning:   Code Status: Full Code   Consults: None  Family Communication: Patient's husband  updated at bedside  Severity of Illness: The appropriate patient status for this patient is INPATIENT. Inpatient status is judged to be reasonable and necessary in order to provide the required intensity of service to ensure the patient's safety. The patient's presenting symptoms, physical exam findings, and initial radiographic and laboratory data in the context of their chronic comorbidities is felt to place them at high risk for further clinical deterioration. Furthermore, it is not anticipated that the patient will be medically stable for discharge from the hospital within 2 midnights of admission.   * I certify that at the point of admission it is my clinical judgment that the patient will require inpatient hospital care spanning beyond 2 midnights from the point of admission due to high intensity of service, high risk for further deterioration and high frequency of surveillance required.*  Author: Verdene Lennert, MD 01/26/2023 4:59 PM  For on call review [email or web address].

## 2023-01-26 NOTE — Discharge Instructions (Signed)
You have been advised to follow up immediately in the emergency department for concerning signs.symptoms. If you declined EMS transport, please have a family member take you directly to the ED at this time. Do not delay. Based on concerns about condition, if you do not follow up in th e ED, you may risk poor outcomes including worsening of condition, delayed treatment and potentially life threatening issues. If you have declined to go to the ED at this time, you should call your PCP immediately to set up a follow up appointment. ° °Go to ED for red flag symptoms, including; fevers you cannot reduce with Tylenol/Motrin, severe headaches, vision changes, numbness/weakness in part of the body, lethargy, confusion, intractable vomiting, severe dehydration, chest pain, breathing difficulty, severe persistent abdominal or pelvic pain, signs of severe infection (increased redness, swelling of an area), feeling faint or passing out, dizziness, etc. You should especially go to the ED for sudden acute worsening of condition if you do not elect to go at this time.  °

## 2023-01-26 NOTE — ED Provider Notes (Signed)
[facility] Provider Note    Event Date/Time   First MD Initiated Contact with Patient 01/26/23 1153     (approximate)   History   Shortness of Breath   HPI  Shelley Thomas is a 82 y.o. female with a past medical history of COPD, sepsis due to community-acquired pneumonia who presents today for evaluation of cough and shortness of breath.  Patient reports that she has chest congestion, runny nose, body aches, and chills that began yesterday.  She reports that she went to urgent care for the symptoms today, and they called an ambulance for her because her oxygen level was low.  Patient reports that her temperature was 84 F when the ambulance came.  Patient reports that she is still smoking daily.  She has inhalers that she does not use regularly at home.  She is not normally on home oxygen.  She denies any known sick contacts.  She denies abdominal pain, nausea, vomiting, diarrhea.  She denies chest pain.  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   CAP (community acquired pneumonia) 12/26/2021   COPD with acute exacerbation (HCC) 12/26/2021   AKI (acute kidney injury) (HCC) 12/26/2021   Hypertension 12/26/2021   High cholesterol 12/26/2021   Personal history of tobacco use, presenting hazards to health 06/20/2016          Physical Exam   Triage Vital Signs: ED Triage Vitals  Encounter Vitals Group     BP 01/26/23 1138 (!) 126/114     Systolic BP Percentile --      Diastolic BP Percentile --      Pulse Rate 01/26/23 1138 (!) 109     Resp 01/26/23 1138 (!) 22     Temp 01/26/23 1138 99.1 F (37.3 C)     Temp Source 01/26/23 1138 Oral     SpO2  01/26/23 1139 91 %     Weight 01/26/23 1139 106 lb 14.8 oz (48.5 kg)     Height 01/26/23 1139 5\' 2"  (1.575 m)     Head Circumference --      Peak Flow --      Pain Score 01/26/23 1139 0     Pain Loc --      Pain Education --      Exclude from Growth Chart --     Most recent vital signs: Vitals:   01/26/23 1445 01/26/23 1500  BP:  (!) 143/86  Pulse: 90 92  Resp: (!) 22 (!) 21  Temp:  98.8 F (37.1 C)  SpO2: 96% 97%    Physical Exam Vitals and nursing note reviewed.  Constitutional:      General: Awake and alert. No acute distress.    Appearance: Normal appearance. The patient is normal weight.  HENT:     Head: Normocephalic and atraumatic.     Mouth: Mucous membranes are moist.  Eyes:     General: PERRL. Normal EOMs        Right eye: No discharge.        Left eye: No discharge.     Conjunctiva/sclera: Conjunctivae normal.  Cardiovascular:     Rate and Rhythm: Normal rate and regular rhythm.  Pulses: Normal pulses.  Pulmonary:     Effort: Tachypnea present, though no accessory muscle use.  Patient able to speak in complete sentences.  On 3-4 L nasal cannula    Breath sounds: Coarse lung sounds bilaterally with expiratory wheezes Abdominal:     Abdomen is soft. There is no abdominal tenderness. No rebound or guarding. No distention. Musculoskeletal:        General: No swelling. Normal range of motion.     Cervical back: Normal range of motion and neck supple.  Skin:    General: Skin is warm and dry.     Capillary Refill: Capillary refill takes less than 2 seconds.     Findings: No rash.  Neurological:     Mental Status: The patient is awake and alert.      ED Results / Procedures / Treatments   Labs (all labs ordered are listed, but only abnormal results are displayed) Labs Reviewed  CBC - Abnormal; Notable for the following components:      Result Value   WBC 12.1 (*)    All other components within normal limits  COMPREHENSIVE METABOLIC PANEL -  Abnormal; Notable for the following components:   Glucose, Bld 150 (*)    BUN 31 (*)    Creatinine, Ser 1.11 (*)    Alkaline Phosphatase 34 (*)    GFR, Estimated 50 (*)    All other components within normal limits  RESP PANEL BY RT-PCR (RSV, FLU A&B, COVID)  RVPGX2  CULTURE, BLOOD (ROUTINE X 2)  CULTURE, BLOOD (ROUTINE X 2)  RESPIRATORY PANEL BY PCR  LACTIC ACID, PLASMA  PROTIME-INR  APTT  PROCALCITONIN  CBC WITH DIFFERENTIAL/PLATELET  BASIC METABOLIC PANEL  HIV ANTIBODY (ROUTINE TESTING W REFLEX)     EKG     RADIOLOGY I independently reviewed and interpreted imaging and agree with radiologists findings.     PROCEDURES:  Critical Care performed:   Procedures   MEDICATIONS ORDERED IN ED: Medications  cefTRIAXone (ROCEPHIN) 2 g in sodium chloride 0.9 % 100 mL IVPB (0 g Intravenous Stopped 01/26/23 1302)  azithromycin (ZITHROMAX) 500 mg in sodium chloride 0.9 % 250 mL IVPB (0 mg Intravenous Stopped 01/26/23 1348)  enoxaparin (LOVENOX) injection 40 mg (has no administration in time range)  predniSONE (DELTASONE) tablet 40 mg (has no administration in time range)  ipratropium-albuterol (DUONEB) 0.5-2.5 (3) MG/3ML nebulizer solution 3 mL (3 mLs Nebulization Given 01/26/23 1457)  chlorpheniramine-HYDROcodone (TUSSIONEX) 10-8 MG/5ML suspension 5 mL (has no administration in time range)  guaiFENesin (MUCINEX) 12 hr tablet 600 mg (600 mg Oral Given 01/26/23 1457)  fenofibrate tablet 160 mg (has no administration in time range)  methylPREDNISolone sodium succinate (SOLU-MEDROL) 125 mg/2 mL injection 125 mg (125 mg Intravenous Given 01/26/23 1233)  sodium chloride 0.9 % bolus 1,000 mL (0 mLs Intravenous Stopped 01/26/23 1444)  ipratropium-albuterol (DUONEB) 0.5-2.5 (3) MG/3ML nebulizer solution 3 mL (3 mLs Nebulization Given 01/26/23 1305)     IMPRESSION / MDM / ASSESSMENT AND PLAN / ED COURSE  I reviewed the triage vital signs and the nursing notes.   Differential  diagnosis includes, but is not limited to, pneumonia, sepsis, COVID, influenza.  I reviewed the patient's chart.  Patient went to urgent care this morning with the same complaint and was found to be hypoxic to 84-85% on room air and was sent via EMS to [facility].  Her temperature with EMS was 76 F with a heart rate of 110.  She had an admission 1 year  ago for the same complaint.  Patient is awake and alert, tachycardic to the 110s on arrival, mildly tachypneic, and on 4 L of oxygen via nasal cannula with a oxygen saturation in the 80s upon my initial evaluation.  She was placed on the cardiac monitor and continuous pulse oximetry.  Further workup is indicated.  Sepsis order set was initiated after her white blood cell count which was ordered in triage was elevated to 12, and EMS temperature noted to be 101 F, along with her tachycardia and tachypnea.  Lactate is within normal limits.  Will treat empirically for COPD exacerbation and pneumonia with broad-spectrum antibiotics and Solu-Medrol.  She was also given a DuoNeb.  COVID, flu, RSV swab is negative.   X-ray reveals findings consistent with multifocal pneumonia.  I recommended admission to the hospitalist service for pneumonia, COPD exacerbation, hypoxia, and possible sepsis.  Patient and her family member agree with plan for hospitalization.  She was accepted by Dr. Huel Cote.   Patient's presentation is most consistent with acute presentation with potential threat to life or bodily function.      FINAL CLINICAL IMPRESSION(S) / ED DIAGNOSES   Final diagnoses:  COPD exacerbation (HCC)  Hypoxia  Multifocal pneumonia     Rx / DC Orders   ED Discharge Orders     None        Note:  This document was prepared using Dragon voice recognition software and may include unintentional dictation errors.   Jackelyn Hoehn, PA-C 01/26/23 1717    Shaune Pollack, MD 01/27/23 [phone]

## 2023-01-26 NOTE — Assessment & Plan Note (Signed)
Current creatinine elevated at 1.11 previously 0.99.  On admission last year, patient's creatinine normalized to 1.16 prior to discharge.  Overall, most likely chronic renal disease rather than AKI.  - Continue to monitor while admitted.

## 2023-01-27 DIAGNOSIS — J189 Pneumonia, unspecified organism: Secondary | ICD-10-CM | POA: Diagnosis not present

## 2023-01-27 LAB — BASIC METABOLIC PANEL
Anion gap: 7 (ref 5–15)
BUN: 39 mg/dL — ABNORMAL HIGH (ref 8–23)
CO2: 24 mmol/L (ref 22–32)
Calcium: 8.8 mg/dL — ABNORMAL LOW (ref 8.9–10.3)
Chloride: 108 mmol/L (ref 98–111)
Creatinine, Ser: 0.99 mg/dL (ref 0.44–1.00)
GFR, Estimated: 57 mL/min — ABNORMAL LOW (ref >60)
Glucose, Bld: 173 mg/dL — ABNORMAL HIGH (ref 70–99)
Potassium: 3.7 mmol/L (ref 3.5–5.1)
Sodium: 139 mmol/L (ref 135–145)

## 2023-01-27 LAB — CBC WITH DIFFERENTIAL/PLATELET
Abs Immature Granulocytes: 0.04 10*3/uL (ref 0.00–0.07)
Basophils Absolute: 0 10*3/uL (ref 0.0–0.1)
Basophils Relative: 0 %
Eosinophils Absolute: 0 10*3/uL (ref 0.0–0.5)
Eosinophils Relative: 0 %
HCT: 32.2 % — ABNORMAL LOW (ref 36.0–46.0)
Hemoglobin: 10.6 g/dL — ABNORMAL LOW (ref 12.0–15.0)
Immature Granulocytes: 0 %
Lymphocytes Relative: 11 %
Lymphs Abs: 1.2 10*3/uL (ref 0.7–4.0)
MCH: 29.7 pg (ref 26.0–34.0)
MCHC: 32.9 g/dL (ref 30.0–36.0)
MCV: 90.2 fL (ref 80.0–100.0)
Monocytes Absolute: 0.5 10*3/uL (ref 0.1–1.0)
Monocytes Relative: 5 %
Neutro Abs: 9 10*3/uL — ABNORMAL HIGH (ref 1.7–7.7)
Neutrophils Relative %: 84 %
Platelets: 158 10*3/uL (ref 150–400)
RBC: 3.57 MIL/uL — ABNORMAL LOW (ref 3.87–5.11)
RDW: 14 % (ref 11.5–15.5)
WBC: 10.7 10*3/uL — ABNORMAL HIGH (ref 4.0–10.5)
nRBC: 0 % (ref 0.0–0.2)

## 2023-01-27 LAB — BRAIN NATRIURETIC PEPTIDE: B Natriuretic Peptide: 141.9 pg/mL — ABNORMAL HIGH (ref 0.0–100.0)

## 2023-01-27 LAB — HIV ANTIBODY (ROUTINE TESTING W REFLEX): HIV Screen 4th Generation wRfx: NONREACTIVE

## 2023-01-27 MED ORDER — TRAZODONE HCL 50 MG PO TABS
50.0000 mg | ORAL_TABLET | Freq: Every evening | ORAL | Status: DC | PRN
  Administered 2023-01-27 – 2023-01-28 (×2): 50 mg via ORAL
  Filled 2023-01-27 (×2): qty 1

## 2023-01-27 MED ORDER — BUDESONIDE 0.5 MG/2ML IN SUSP
0.5000 mg | Freq: Two times a day (BID) | RESPIRATORY_TRACT | Status: DC
  Administered 2023-01-27 – 2023-01-30 (×6): 0.5 mg via RESPIRATORY_TRACT
  Filled 2023-01-27 (×6): qty 2

## 2023-01-27 MED ORDER — SENNOSIDES-DOCUSATE SODIUM 8.6-50 MG PO TABS: 1.0000 | ORAL_TABLET | Freq: Every evening | ORAL | Status: DC | PRN

## 2023-01-27 MED ORDER — ONDANSETRON HCL 4 MG/2ML IJ SOLN: 4.0000 mg | Freq: Four times a day (QID) | INTRAMUSCULAR | Status: DC | PRN

## 2023-01-27 MED ORDER — HYDRALAZINE HCL 20 MG/ML IJ SOLN: 10.0000 mg | INTRAMUSCULAR | Status: DC | PRN

## 2023-01-27 MED ORDER — METOPROLOL TARTRATE 5 MG/5ML IV SOLN
5.0000 mg | INTRAVENOUS | Status: DC | PRN
Start: 2023-01-27 — End: 2023-01-30
  Administered 2023-01-30: 5 mg via INTRAVENOUS
  Filled 2023-01-27: qty 5

## 2023-01-27 MED ORDER — AZITHROMYCIN 250 MG PO TABS
500.0000 mg | ORAL_TABLET | Freq: Every day | ORAL | Status: AC
  Administered 2023-01-28 – 2023-01-30 (×3): 500 mg via ORAL
  Filled 2023-01-27 (×3): qty 2

## 2023-01-27 NOTE — Evaluation (Signed)
Physical Therapy Evaluation Patient Details Name: Shelley Thomas MRN: [id number] DOB: [birth year] Today's Date: 01/27/2023  History of Present Illness  Shelley Thomas is a 82 y.o. female with medical history significant of hypertension, hyperlipidemia, clinically diagnosed COPD, who presents to the ED due to shortness of breath. Patient initially went to an urgent care at which time she was noted to be saturating at 82% on room air with heart rate of 116.  She was then taken to the ED via EMS.  MD assessment includes COPD exacerbation and CAP.   Clinical Impression  Pt was pleasant and motivated to participate during the session and put forth good effort throughout. Pt found on 2L in supine with SpO2 86-87%. Sitting up at the EOB SpO2 remained about the same. Spoke to nursing and increased pt to 3L with SpO2 increasing to the low 90s. Pt's SpO2 stayed >/= 88% on 3L with graded amb distances per below and was mostly in the low 90s. At the end of the session at rest in the chair she was in the mid 90s on 3L so returned her to 2L where she stayed in the low 90s, nursing and MD notified.  Pt ind with all below functional tasks and stated no desire for continued PT services.  Will complete PT orders at this time but will reassess pt pending a change in status upon receipt of new PT orders.           If plan is discharge home, recommend the following: Assist for transportation   Can travel by private vehicle        Equipment Recommendations None recommended by PT  Recommendations for Other Services       Functional Status Assessment Patient has not had a recent decline in their functional status     Precautions / Restrictions Precautions Precautions: Other (comment) (o2 saturation) Restrictions Weight Bearing Restrictions Per Provider Order: No      Mobility  Bed Mobility Overal bed mobility: Independent                  Transfers Overall transfer level:  Independent                      Ambulation/Gait Ambulation/Gait assistance: Independent Gait Distance (Feet): 30 Feet x 1, 60 Feet x 1, 150 Feet x 1 Assistive device: None Gait Pattern/deviations: WFL(Within Functional Limits) Gait velocity: WNL     General Gait Details: Pt steady with ambulation without an AD including during start/stops, 180 deg turns, and obstacle avoidance in tight spaces  Stairs            Wheelchair Mobility     Tilt Bed    Modified Rankin (Stroke Patients Only)       Balance Overall balance assessment: No apparent balance deficits (not formally assessed)                                           Pertinent Vitals/Pain Pain Assessment Pain Assessment: No/denies pain    Home Living Family/patient expects to be discharged to:: Private residence Living Arrangements: Spouse/significant other Available Help at Discharge: Family Type of Home: House Home Access: Level entry   Entrance Stairs-Number of Steps: 3   Home Layout: One level Home Equipment: None Additional Comments: Pt is adamant about not using AD    Prior Function Prior  Level of Function : Independent/Modified Independent;Driving             Mobility Comments: Ind amb community distances without an AD, no fall history ADLs Comments: (I) ADL/IADL in home and community. States that she is very active.     Extremity/Trunk Assessment   Upper Extremity Assessment Upper Extremity Assessment: Overall WFL for tasks assessed    Lower Extremity Assessment Lower Extremity Assessment: Overall WFL for tasks assessed       Communication   Communication Communication: No apparent difficulties  Cognition Arousal: Alert Behavior During Therapy: WFL for tasks assessed/performed Overall Cognitive Status: Within Functional Limits for tasks assessed                                          General Comments General comments (skin  integrity, edema, etc.): Pt on 2L throughout session; O2 tank used for walking into the bathroom. After walking approx 8 feet into the bathroom and sitting on toilet, O2 87-88% on 2L. Pt stood at sink for grooming x3 minutes; afterwards, hands cold and not getting good reading, but when reading arrived it was 90%. Education provided on using O2 constantly at this time until otherwise advised by medical team.    Exercises     Assessment/Plan    PT Assessment Patient does not need any further PT services  PT Problem List         PT Treatment Interventions      PT Goals (Current goals can be found in the Care Plan section)  Acute Rehab PT Goals PT Goal Formulation: All assessment and education complete, DC therapy    Frequency       Co-evaluation               AM-PAC PT "6 Clicks" Mobility  Outcome Measure Help needed turning from your back to your side while in a flat bed without using bedrails?: None Help needed moving from lying on your back to sitting on the side of a flat bed without using bedrails?: None Help needed moving to and from a bed to a chair (including a wheelchair)?: None Help needed standing up from a chair using your arms (e.g., wheelchair or bedside chair)?: None Help needed to walk in hospital room?: None Help needed climbing 3-5 steps with a railing? : None 6 Click Score: 24    End of Session Equipment Utilized During Treatment: Gait belt;Oxygen Activity Tolerance: Patient tolerated treatment well Patient left: in chair;with call bell/phone within reach;with chair alarm set Nurse Communication: Mobility status;Other (comment) (SpO2 response to activity) PT Visit Diagnosis: Difficulty in walking, not elsewhere classified (R26.2)    Time: 1610-9604 PT Time Calculation (min) (ACUTE ONLY): 26 min   Charges:   PT Evaluation $PT Eval Low Complexity: 1 Low   PT General Charges $$ ACUTE PT VISIT: 1 Visit       D. Scott Tanya Marvin PT, DPT 01/27/23,  11:32 AM

## 2023-01-27 NOTE — Progress Notes (Signed)
PROGRESS NOTE    Shelley Thomas  ZOX:[id number] DOB: 1940-08-10 DOA: 01/26/2023 PCP: Jaclyn Shaggy, MD    Brief Narrative:  82 year old with history of HTN, HLD, COPD presents to the ED with shortness of breath ongoing for past 2-3 days.  Upon admission diagnosed with multifocal community-acquired pneumonia and COPD exacerbation.  Patient started on bronchodilators, antibiotics and steroids.   Assessment & Plan:  Principal Problem:   CAP (community acquired pneumonia) Active Problems:   COPD with acute exacerbation (HCC)   CKD (chronic kidney disease), stage III (HCC)   Hypertension   Acute respiratory distress secondary to CAP/COPD exacerbation -Continue Rocephin/azithromycin, bronchodilators scheduled and as needed, on steroids.  I-S/flutter valve.  Out of bed to chair.  Supportive care.  COVID, RSV, flu, respiratory panel is negative  CKD stage II - Creatinine around 1.0.  Closely monitor.  Essential hypertension - Holding home p.o. medications.  On IV as needed.   DVT prophylaxis: enoxaparin (LOVENOX) injection 40 mg Start: 01/26/23 2200    Code Status: Full Code Family Communication: Spouse at bedside Status is: Inpatient Remains inpatient appropriate because: Continue hospital stay    Subjective:  Patient feeling okay no complaints After working with therapy she did get hypoxic.  Examination:  General exam: Appears calm and comfortable  Respiratory system: Mild bilateral expiratory wheezing Cardiovascular system: S1 & S2 heard, RRR. No JVD, murmurs, rubs, gallops or clicks. No pedal edema. Gastrointestinal system: Abdomen is nondistended, soft and nontender. No organomegaly or masses felt. Normal bowel sounds heard. Central nervous system: Alert and oriented. No focal neurological deficits. Extremities: Symmetric 5 x 5 power. Skin: No rashes, lesions or ulcers Psychiatry: Judgement and insight appear normal. Mood & affect appropriate.                 Diet Orders (From admission, onward)     Start     Ordered   01/26/23 1432  Diet regular Room service appropriate? Yes; Fluid consistency: Thin  Diet effective now       Question Answer Comment  Room service appropriate? Yes   Fluid consistency: Thin      01/26/23 1433            Objective: Vitals:   01/26/23 2129 01/27/23 0200 01/27/23 0311 01/27/23 0749  BP: 119/75  (!) 101/59 128/77  Pulse: 88  71 72  Resp: 20  16 15   Temp: 97.8 F (36.6 C)  98.2 F (36.8 C) 97.9 F (36.6 C)  TempSrc: Oral  Oral Oral  SpO2: 95% 96% 98% 96%  Weight:      Height:        Intake/Output Summary (Last 24 hours) at 01/27/2023 1138 Last data filed at 01/26/2023 2232 Gross per 24 hour  Intake 1710 ml  Output --  Net 1710 ml   Filed Weights   01/26/23 1139  Weight: 48.5 kg    Scheduled Meds:  [START ON 01/28/2023] azithromycin  500 mg Oral Daily   budesonide (PULMICORT) nebulizer solution  0.5 mg Nebulization BID   enoxaparin (LOVENOX) injection  40 mg Subcutaneous Q24H   fenofibrate  160 mg Oral Daily   guaiFENesin  600 mg Oral BID   ipratropium-albuterol  3 mL Nebulization Q6H   predniSONE  40 mg Oral Q breakfast   Continuous Infusions:  azithromycin Stopped (01/26/23 1348)   cefTRIAXone (ROCEPHIN)  IV Stopped (01/26/23 1302)    Nutritional status     Body mass index is 19.56 kg/m.  Data Reviewed:  CBC: Recent Labs  Lab 01/26/23 1141 01/27/23 0421  WBC 12.1* 10.7*  NEUTROABS  --  9.0*  HGB 12.5 10.6*  HCT 38.6 32.2*  MCV 91.9 90.2  PLT 173 158   Basic Metabolic Panel: Recent Labs  Lab 01/26/23 1141 01/27/23 0421  NA 136 139  K 3.7 3.7  CL 100 108  CO2 27 24  GLUCOSE 150* 173*  BUN 31* 39*  CREATININE 1.11* 0.99  CALCIUM 9.3 8.8*   GFR: Estimated Creatinine Clearance: 34.1 mL/min (by C-G formula based on SCr of 0.99 mg/dL). Liver Function Tests: Recent Labs  Lab 01/26/23 1141  AST 21  ALT 14  ALKPHOS 34*   BILITOT 0.9  PROT 7.7  ALBUMIN 3.8   No results for input(s): "LIPASE", "AMYLASE" in the last 168 hours. No results for input(s): "AMMONIA" in the last 168 hours. Coagulation Profile: Recent Labs  Lab 01/26/23 1220  INR 1.1   Cardiac Enzymes: No results for input(s): "CKTOTAL", "CKMB", "CKMBINDEX", "TROPONINI" in the last 168 hours. BNP (last 3 results) No results for input(s): "PROBNP" in the last 8760 hours. HbA1C: No results for input(s): "HGBA1C" in the last 72 hours. CBG: No results for input(s): "GLUCAP" in the last 168 hours. Lipid Profile: No results for input(s): "CHOL", "HDL", "LDLCALC", "TRIG", "CHOLHDL", "LDLDIRECT" in the last 72 hours. Thyroid Function Tests: No results for input(s): "TSH", "T4TOTAL", "FREET4", "T3FREE", "THYROIDAB" in the last 72 hours. Anemia Panel: No results for input(s): "VITAMINB12", "FOLATE", "FERRITIN", "TIBC", "IRON", "RETICCTPCT" in the last 72 hours. Sepsis Labs: Recent Labs  Lab 01/26/23 1141 01/26/23 1220  PROCALCITON 0.20  --   LATICACIDVEN  --  1.8    Recent Results (from the past 240 hours)  Resp panel by RT-PCR (RSV, Flu A&B, Covid) Anterior Nasal Swab     Status: None   Collection Time: 01/26/23 12:20 PM   Specimen: Anterior Nasal Swab  Result Value Ref Range Status   SARS Coronavirus 2 by RT PCR NEGATIVE NEGATIVE Final    Comment: (NOTE) SARS-CoV-2 target nucleic acids are NOT DETECTED.  The SARS-CoV-2 RNA is generally detectable in upper respiratory specimens during the acute phase of infection. The lowest concentration of SARS-CoV-2 viral copies this assay can detect is 138 copies/mL. A negative result does not preclude SARS-Cov-2 infection and should not be used as the sole basis for treatment or other patient management decisions. A negative result may occur with  improper specimen collection/handling, submission of specimen other than nasopharyngeal swab, presence of viral mutation(s) within the areas  targeted by this assay, and inadequate number of viral copies(<138 copies/mL). A negative result must be combined with clinical observations, patient history, and epidemiological information. The expected result is Negative.  Fact Sheet for Patients:  BloggerCourse.com  Fact Sheet for Healthcare Providers:  SeriousBroker.it  This test is no t yet approved or cleared by the [place] FDA and  has been authorized for detection and/or diagnosis of SARS-CoV-2 by FDA under an Emergency Use Authorization (EUA). This EUA will remain  in effect (meaning this test can be used) for the duration of the COVID-19 declaration under Section 564(b)(1) of the Act, 21 U.S.C.section 360bbb-3(b)(1), unless the authorization is terminated  or revoked sooner.       Influenza A by PCR NEGATIVE NEGATIVE Final   Influenza B by PCR NEGATIVE NEGATIVE Final    Comment: (NOTE) The Xpert Xpress SARS-CoV-2/FLU/RSV plus assay is intended as an aid in the diagnosis of influenza from Nasopharyngeal swab specimens and  should not be used as a sole basis for treatment. Nasal washings and aspirates are unacceptable for Xpert Xpress SARS-CoV-2/FLU/RSV testing.  Fact Sheet for Patients: BloggerCourse.com  Fact Sheet for Healthcare Providers: SeriousBroker.it  This test is not yet approved or cleared by the [place] FDA and has been authorized for detection and/or diagnosis of SARS-CoV-2 by FDA under an Emergency Use Authorization (EUA). This EUA will remain in effect (meaning this test can be used) for the duration of the COVID-19 declaration under Section 564(b)(1) of the Act, 21 U.S.C. section 360bbb-3(b)(1), unless the authorization is terminated or revoked.     Resp Syncytial Virus by PCR NEGATIVE NEGATIVE Final    Comment: (NOTE) Fact Sheet for  Patients: BloggerCourse.com  Fact Sheet for Healthcare Providers: SeriousBroker.it  This test is not yet approved or cleared by the [place] FDA and has been authorized for detection and/or diagnosis of SARS-CoV-2 by FDA under an Emergency Use Authorization (EUA). This EUA will remain in effect (meaning this test can be used) for the duration of the COVID-19 declaration under Section 564(b)(1) of the Act, 21 U.S.C. section 360bbb-3(b)(1), unless the authorization is terminated or revoked.  Performed at [facility], [address] [place], [place] [address]   Blood Culture (routine x 2)     Status: None (Preliminary result)   Collection Time: 01/26/23 12:20 PM   Specimen: BLOOD  Result Value Ref Range Status   Specimen Description BLOOD BLOOD LEFT FOREARM  Final   Special Requests   Final    BOTTLES DRAWN AEROBIC AND ANAEROBIC Blood Culture adequate volume   Culture   Final    NO GROWTH < 24 HOURS Performed at [facility], [address] [place], [place] [address]    Report Status PENDING  Incomplete  Blood Culture (routine x 2)     Status: None (Preliminary result)   Collection Time: 01/26/23 12:20 PM   Specimen: BLOOD  Result Value Ref Range Status   Specimen Description BLOOD LEFT ANTECUBITAL  Final   Special Requests   Final    BOTTLES DRAWN AEROBIC AND ANAEROBIC Blood Culture adequate volume   Culture   Final    NO GROWTH < 24 HOURS Performed at [facility], [address] [place], [place] [address]    Report Status PENDING  Incomplete  Respiratory (~20 pathogens) panel by PCR     Status: None   Collection Time: 01/26/23  4:22 PM   Specimen: Nasopharyngeal Swab; Respiratory  Result Value Ref Range Status   Adenovirus NOT DETECTED NOT DETECTED Final   Coronavirus 229E NOT DETECTED NOT DETECTED Final    Comment: (NOTE) The Coronavirus on the Respiratory Panel, DOES NOT test for  the novel  Coronavirus (2019 nCoV)    Coronavirus HKU1 NOT DETECTED NOT DETECTED Final   Coronavirus NL63 NOT DETECTED NOT DETECTED Final   Coronavirus OC43 NOT DETECTED NOT DETECTED Final   Metapneumovirus NOT DETECTED NOT DETECTED Final   Rhinovirus / Enterovirus NOT DETECTED NOT DETECTED Final   Influenza A NOT DETECTED NOT DETECTED Final   Influenza B NOT DETECTED NOT DETECTED Final   Parainfluenza Virus 1 NOT DETECTED NOT DETECTED Final   Parainfluenza Virus 2 NOT DETECTED NOT DETECTED Final   Parainfluenza Virus 3 NOT DETECTED NOT DETECTED Final   Parainfluenza Virus 4 NOT DETECTED NOT DETECTED Final   Respiratory Syncytial Virus NOT DETECTED NOT DETECTED Final   Bordetella pertussis NOT DETECTED NOT DETECTED Final   Bordetella Parapertussis  NOT DETECTED NOT DETECTED Final   Chlamydophila pneumoniae NOT DETECTED NOT DETECTED Final   Mycoplasma pneumoniae NOT DETECTED NOT DETECTED Final    Comment: Performed at [facility], [address], [place], [place] [address]         Radiology Studies: DG Chest 2 View Result Date: 01/26/2023 CLINICAL DATA:  Cough.  Chest congestion. EXAM: CHEST - 2 VIEW COMPARISON:  Chest radiograph dated September 16, 2022. FINDINGS: The heart size and mediastinal contours are within normal limits. Aortic atherosclerosis. Streaky right infrahilar and patchy left middle and lower lung zone opacities. No pleural effusion or pneumothorax. No acute osseous abnormality. IMPRESSION: Streaky right infrahilar and left middle and lower lung zone opacities may be secondary to multifocal pneumonia/atypical pneumonia. Electronically Signed   By: Hart Robinsons M.D.   On: 01/26/2023 13:41           LOS: 1 day   Time spent= 35 mins    Miguel Rota, MD [facility]  If 7PM-7AM, please contact night-coverage  01/27/2023, 11:38 AM

## 2023-01-27 NOTE — Evaluation (Signed)
Occupational Therapy Evaluation Patient Details Name: Shelley Thomas MRN: [id number] DOB: May 30, 1940 Today's Date: 01/27/2023   History of Present Illness Shelley Thomas is a 82 y.o. female with medical history significant of hypertension, hyperlipidemia, clinically diagnosed COPD, who presents to the ED due to shortness of breath.     [name] states that she began to experience a cough with sputum production approximately 2-3 days ago which has significantly worsened and that is what brought her in today.  She also endorses rhinorrhea, body aches, generalized malaise with chills.  She denies any shortness of breath, fever, nausea, vomiting, diarrhea.     Patient initially went to an urgent care at which time she was noted to be saturating at 82% on room air with heart rate of 116.  She was then taken to the ED via EMS.   Clinical Impression   Patient received for OT evaluation. See flowsheet below for details of function. Generally, patient requiring supervision (assist for O2 tank) for functional mobility, and MOD (I) for ADLs. Needs continued education about O2 saturation and energy conservation strategies, as pt wanting to remove her oxygen during activities (such as walking to the bathroom), even though she is desaturating. Patient will benefit from continued OT while in acute care.        If plan is discharge home, recommend the following: Assistance with cooking/housework;Assist for transportation    Functional Status Assessment  Patient has had a recent decline in their functional status and demonstrates the ability to make significant improvements in function in a reasonable and predictable amount of time.  Equipment Recommendations  Tub/shower seat    Recommendations for Other Services       Precautions / Restrictions Precautions Precautions: Other (comment) (o2 saturation) Restrictions Weight Bearing Restrictions Per Provider Order: No      Mobility Bed  Mobility               General bed mobility comments: Not tested; pt found in recliner after PT session a few minutes earlier.    Transfers Overall transfer level: Modified independent Equipment used: None                      Balance Overall balance assessment: No apparent balance deficits (not formally assessed)                                         ADL either performed or assessed with clinical judgement   ADL Overall ADL's : Modified independent                                       General ADL Comments: Able to use toilet, perform hygiene and clothing management, brush teeth, and wash hands independently. Demonstrates no physical deficits besides O2 saturation dropping.     Vision Baseline Vision/History: 1 Wears glasses       Perception         Praxis         Pertinent Vitals/Pain Pain Assessment Pain Assessment: No/denies pain     Extremity/Trunk Assessment Upper Extremity Assessment Upper Extremity Assessment: Overall WFL for tasks assessed   Lower Extremity Assessment Lower Extremity Assessment: Overall WFL for tasks assessed       Communication Communication Communication: No apparent difficulties  Cognition Arousal: Alert Behavior During Therapy: WFL for tasks assessed/performed Overall Cognitive Status: Within Functional Limits for tasks assessed                                 General Comments: Orientation not explicity tested; pt did require some repeated education, such as on importance of keeping O2 on at all times due to desaturation (which is occurring even with O2 on 2L), as pt was about to get up to the bathroom without calling for assistance and while having taken O2 off when OT arrived.     General Comments  Pt on 2L throughout session; O2 tank used for walking into the bathroom. After walking approx 8 feet into the bathroom and sitting on toilet, O2 87-88% on 2L. Pt stood  at sink for grooming x3 minutes; afterwards, hands cold and not getting good reading, but when reading arrived it was 90%. Education provided on using O2 constantly at this time until otherwise advised by medical team.    Exercises     Shoulder Instructions      Home Living Family/patient expects to be discharged to:: Private residence Living Arrangements: Spouse/significant other Available Help at Discharge: Family Type of Home: House Home Access: Stairs to enter Secretary/administrator of Steps: 3   Home Layout: One level               Home Equipment: None   Additional Comments: Pt is adamant about not using AD; does not like to be perceived as "old"      Prior Functioning/Environment Prior Level of Function : Independent/Modified Independent;Driving             Mobility Comments: No AD; denies falls. Not using O2 at home. ADLs Comments: (I) ADL/IADL in home and community. States that she is very active.        OT Problem List: Decreased activity tolerance;Other (comment) (decreased oxygen saturation)      OT Treatment/Interventions: Patient/family education;Therapeutic activities;Energy conservation    OT Goals(Current goals can be found in the care plan section) Acute Rehab OT Goals Patient Stated Goal: Go home OT Goal Formulation: With patient Time For Goal Achievement: 02/10/23 Potential to Achieve Goals: Good ADL Goals Additional ADL Goal #1: Patient will demonstrate use of energy conservation strategies during ADL/IADL tasks by d/c.  OT Frequency: Min 1X/week    Co-evaluation              AM-PAC OT "6 Clicks" Daily Activity     Outcome Measure Help from another person eating meals?: None Help from another person taking care of personal grooming?: None Help from another person toileting, which includes using toliet, bedpan, or urinal?: None Help from another person bathing (including washing, rinsing, drying)?: None Help from another person  to put on and taking off regular upper body clothing?: None Help from another person to put on and taking off regular lower body clothing?: None 6 Click Score: 24   End of Session Equipment Utilized During Treatment: Oxygen Nurse Communication: Mobility status  Activity Tolerance: Patient tolerated treatment well Patient left: in chair;with call bell/phone within reach;with chair alarm set;with family/visitor present (husband arrived during session.)  OT Visit Diagnosis: Other (comment) (hypoxia)                Time: 9604-5409 OT Time Calculation (min): 21 min Charges:  OT General Charges $OT Visit: 1 Visit OT Evaluation $OT Eval Moderate  Complexity: 1 Mod OT Treatments $Self Care/Home Management : 8-22 mins  Linward Foster, MS, OTR/L  Alvester Morin 01/27/2023, 10:44 AM

## 2023-01-27 NOTE — Progress Notes (Signed)
Initial Nutrition Assessment  DOCUMENTATION CODES:   Not applicable  INTERVENTION:  - Regular diet.  - Add Ensure Plus High Protein po BID, each supplement provides 350 kcal and 20 grams of protein. - Add Multivitamin with minerals daily - Monitor weight trends.   NUTRITION DIAGNOSIS:   Increased nutrient needs related to catabolic illness, chronic illness (COPD) as evidenced by estimated needs.  GOAL:   Patient will meet greater than or equal to 90% of their needs  MONITOR:   PO intake, Supplement acceptance, Weight trends  REASON FOR ASSESSMENT:   Consult Assessment of nutrition requirement/status  ASSESSMENT:   82 year old with history of HTN, HLD, COPD who presented with shortness of breath ongoing for past 2-3 days.  Upon admission diagnosed with multifocal community-acquired pneumonia and COPD exacerbation  RD working remotely. Called patient via bedside telephone to obtain nutrition history.  Patient reports a UBW of around 100-110#. Denies any changes in weight outside of that range. Per EMR, weight has been stable.   Patient endorses eating well at home PTA. Usually eats 3 meals a day plus 1-2 Ensures. Appetite good at baseline.   Current appetite is normal and patient endorses eating well so far this admission. No meals intakes documented. Patient agreeable to receive Ensure during admission, likes chocolate.   Medications reviewed and include: -  Labs reviewed:  -   NUTRITION - FOCUSED PHYSICAL EXAM:  RD working remotely  Diet Order:   Diet Order             Diet regular Room service appropriate? Yes; Fluid consistency: Thin  Diet effective now                   EDUCATION NEEDS:  Education needs have been addressed  Skin:  Skin Assessment: Reviewed RN Assessment  Last BM:  unknown  Height:  Ht Readings from Last 1 Encounters:  01/26/23 5\' 2"  (1.575 m)   Weight:  Wt Readings from Last 1 Encounters:  01/26/23 48.5 kg    BMI:   Body mass index is 19.56 kg/m.  Estimated Nutritional Needs:  Kcal:  1450-1700 kcals Protein:  65-75 grams Fluid:  >/= 1.5L    Shelle Iron RD, LDN Contact via Secure Chat.

## 2023-01-27 NOTE — Hospital Course (Addendum)
Brief Narrative:  82 year old with history of HTN, HLD, COPD presents to the ED with shortness of breath ongoing for past 2-3 days.  Upon admission diagnosed with multifocal community-acquired pneumonia and COPD exacerbation.  Patient started on bronchodilators, antibiotics and steroids.   Assessment & Plan:  Principal Problem:   CAP (community acquired pneumonia) Active Problems:   COPD with acute exacerbation (HCC)   CKD (chronic kidney disease), stage III (HCC)   Hypertension   Acute respiratory distress secondary to CAP/COPD exacerbation -Continue Rocephin/azithromycin, bronchodilators scheduled and as needed, on steroids.  I-S/flutter valve.  Out of bed to chair.  Supportive care.  COVID, RSV, flu, respiratory panel is negative  CKD stage II - Creatinine around 1.0.  Closely monitor.  Essential hypertension - Holding home p.o. medications.  On IV as needed.   DVT prophylaxis: enoxaparin (LOVENOX) injection 40 mg Start: 01/26/23 2200    Code Status: Full Code Family Communication: Spouse at bedside Status is: Inpatient Remains inpatient appropriate because: Continue hospital stay    Subjective:  Patient feeling okay no complaints After working with therapy she did get hypoxic.  Examination:  General exam: Appears calm and comfortable  Respiratory system: Mild bilateral expiratory wheezing Cardiovascular system: S1 & S2 heard, RRR. No JVD, murmurs, rubs, gallops or clicks. No pedal edema. Gastrointestinal system: Abdomen is nondistended, soft and nontender. No organomegaly or masses felt. Normal bowel sounds heard. Central nervous system: Alert and oriented. No focal neurological deficits. Extremities: Symmetric 5 x 5 power. Skin: No rashes, lesions or ulcers Psychiatry: Judgement and insight appear normal. Mood & affect appropriate.

## 2023-01-28 DIAGNOSIS — J189 Pneumonia, unspecified organism: Secondary | ICD-10-CM | POA: Diagnosis not present

## 2023-01-28 LAB — CBC
HCT: 32 % — ABNORMAL LOW (ref 36.0–46.0)
Hemoglobin: 10.3 g/dL — ABNORMAL LOW (ref 12.0–15.0)
MCH: 29.9 pg (ref 26.0–34.0)
MCHC: 32.2 g/dL (ref 30.0–36.0)
MCV: 93 fL (ref 80.0–100.0)
Platelets: 176 10*3/uL (ref 150–400)
RBC: 3.44 MIL/uL — ABNORMAL LOW (ref 3.87–5.11)
RDW: 14.1 % (ref 11.5–15.5)
WBC: 11 10*3/uL — ABNORMAL HIGH (ref 4.0–10.5)
nRBC: 0 % (ref 0.0–0.2)

## 2023-01-28 LAB — BASIC METABOLIC PANEL
Anion gap: 7 (ref 5–15)
BUN: 40 mg/dL — ABNORMAL HIGH (ref 8–23)
CO2: 24 mmol/L (ref 22–32)
Calcium: 8.9 mg/dL (ref 8.9–10.3)
Chloride: 109 mmol/L (ref 98–111)
Creatinine, Ser: 0.89 mg/dL (ref 0.44–1.00)
GFR, Estimated: >60 mL/min (ref >60)
Glucose, Bld: 147 mg/dL — ABNORMAL HIGH (ref 70–99)
Potassium: 4 mmol/L (ref 3.5–5.1)
Sodium: 140 mmol/L (ref 135–145)

## 2023-01-28 LAB — MAGNESIUM: Magnesium: 2 mg/dL (ref 1.7–2.4)

## 2023-01-28 MED ORDER — IPRATROPIUM-ALBUTEROL 0.5-2.5 (3) MG/3ML IN SOLN
3.0000 mL | Freq: Three times a day (TID) | RESPIRATORY_TRACT | Status: DC
  Administered 2023-01-28 – 2023-01-29 (×4): 3 mL via RESPIRATORY_TRACT
  Filled 2023-01-28 (×4): qty 3

## 2023-01-28 MED ORDER — IPRATROPIUM-ALBUTEROL 0.5-2.5 (3) MG/3ML IN SOLN: 3.0000 mL | Freq: Four times a day (QID) | RESPIRATORY_TRACT | Status: DC | PRN

## 2023-01-28 NOTE — Progress Notes (Signed)
PROGRESS NOTE    Shelley Thomas  ZOX:[id number] DOB: Sep 09, 1940 DOA: 01/26/2023 PCP: Jaclyn Shaggy, MD    Brief Narrative:  82 year old with history of HTN, HLD, COPD presents to the ED with shortness of breath ongoing for past 2-3 days.  Upon admission diagnosed with multifocal community-acquired pneumonia and COPD exacerbation.  Patient started on bronchodilators, antibiotics and steroids.  COVID, RSV, flu, respiratory panel is negative.  Slowly oxygen was weaned off.   Assessment & Plan:  Principal Problem:   CAP (community acquired pneumonia) Active Problems:   COPD with acute exacerbation (HCC)   CKD (chronic kidney disease), stage III (HCC)   Hypertension   Acute respiratory distress secondary to CAP/COPD exacerbation -Continue Rocephin/azithromycin, bronchodilators scheduled and as needed, on steroids.  I-S/flutter valve.  Out of bed to chair.  Supportive care.  COVID, RSV, flu, respiratory panel is negative  CKD stage II - Creatinine around 1.0.  Closely monitor.  Essential hypertension, stable - Holding home p.o. medications.  On IV as needed.  PT-no follow-up OT-home health Face-to-face done  DVT prophylaxis: enoxaparin (LOVENOX) injection 40 mg Start: 01/26/23 2200    Code Status: Full Code Family Communication: Spouse at bedside Status is: Inpatient Remains inpatient appropriate because: Significantly abnormal BS sounds.     Subjective:  Feels better in terms of breathing.   Examination:  General exam: Appears calm and comfortable  Respiratory system: b/l diminished BS Cardiovascular system: S1 & S2 heard, RRR. No JVD, murmurs, rubs, gallops or clicks. No pedal edema. Gastrointestinal system: Abdomen is nondistended, soft and nontender. No organomegaly or masses felt. Normal bowel sounds heard. Central nervous system: Alert and oriented. No focal neurological deficits. Extremities: Symmetric 5 x 5 power. Skin: No rashes, lesions or  ulcers Psychiatry: Judgement and insight appear normal. Mood & affect appropriate.         Diet Orders (From admission, onward)     Start     Ordered   01/26/23 1432  Diet regular Room service appropriate? Yes; Fluid consistency: Thin  Diet effective now       Question Answer Comment  Room service appropriate? Yes   Fluid consistency: Thin      01/26/23 1433            Objective: Vitals:   01/27/23 2106 01/28/23 0542 01/28/23 0828 01/28/23 0915  BP: 129/75 132/86  121/73  Pulse: 72 71  88  Resp: 16 20    Temp: 98.1 F (36.7 C) 97.7 F (36.5 C)  98.4 F (36.9 C)  TempSrc: Oral Oral  Oral  SpO2: 96% 99% 94% 90%  Weight:      Height:       No intake or output data in the 24 hours ending 01/28/23 1113 Filed Weights   01/26/23 1139  Weight: 48.5 kg    Scheduled Meds:  azithromycin  500 mg Oral Daily   budesonide (PULMICORT) nebulizer solution  0.5 mg Nebulization BID   enoxaparin (LOVENOX) injection  40 mg Subcutaneous Q24H   fenofibrate  160 mg Oral Daily   guaiFENesin  600 mg Oral BID   ipratropium-albuterol  3 mL Nebulization TID   predniSONE  40 mg Oral Q breakfast   Continuous Infusions:  cefTRIAXone (ROCEPHIN)  IV 2 g (01/27/23 1249)    Nutritional status Signs/Symptoms: estimated needs Interventions: Refer to RD note for recommendations, Ensure Enlive (each supplement provides 350kcal and 20 grams of protein), MVI Body mass index is 19.56 kg/m.  Data Reviewed:  CBC: Recent Labs  Lab 01/26/23 1141 01/27/23 0421 01/28/23 0240  WBC 12.1* 10.7* 11.0*  NEUTROABS  --  9.0*  --   HGB 12.5 10.6* 10.3*  HCT 38.6 32.2* 32.0*  MCV 91.9 90.2 93.0  PLT 173 158 176   Basic Metabolic Panel: Recent Labs  Lab 01/26/23 1141 01/27/23 0421 01/28/23 0240  NA 136 139 140  K 3.7 3.7 4.0  CL 100 108 109  CO2 27 24 24   GLUCOSE 150* 173* 147*  BUN 31* 39* 40*  CREATININE 1.11* 0.99 0.89  CALCIUM 9.3 8.8* 8.9  MG  --   --  2.0   GFR: Estimated  Creatinine Clearance: 38 mL/min (by C-G formula based on SCr of 0.89 mg/dL). Liver Function Tests: Recent Labs  Lab 01/26/23 1141  AST 21  ALT 14  ALKPHOS 34*  BILITOT 0.9  PROT 7.7  ALBUMIN 3.8   No results for input(s): "LIPASE", "AMYLASE" in the last 168 hours. No results for input(s): "AMMONIA" in the last 168 hours. Coagulation Profile: Recent Labs  Lab 01/26/23 1220  INR 1.1   Cardiac Enzymes: No results for input(s): "CKTOTAL", "CKMB", "CKMBINDEX", "TROPONINI" in the last 168 hours. BNP (last 3 results) No results for input(s): "PROBNP" in the last 8760 hours. HbA1C: No results for input(s): "HGBA1C" in the last 72 hours. CBG: No results for input(s): "GLUCAP" in the last 168 hours. Lipid Profile: No results for input(s): "CHOL", "HDL", "LDLCALC", "TRIG", "CHOLHDL", "LDLDIRECT" in the last 72 hours. Thyroid Function Tests: No results for input(s): "TSH", "T4TOTAL", "FREET4", "T3FREE", "THYROIDAB" in the last 72 hours. Anemia Panel: No results for input(s): "VITAMINB12", "FOLATE", "FERRITIN", "TIBC", "IRON", "RETICCTPCT" in the last 72 hours. Sepsis Labs: Recent Labs  Lab 01/26/23 1141 01/26/23 1220  PROCALCITON 0.20  --   LATICACIDVEN  --  1.8    Recent Results (from the past 240 hours)  Resp panel by RT-PCR (RSV, Flu A&B, Covid) Anterior Nasal Swab     Status: None   Collection Time: 01/26/23 12:20 PM   Specimen: Anterior Nasal Swab  Result Value Ref Range Status   SARS Coronavirus 2 by RT PCR NEGATIVE NEGATIVE Final    Comment: (NOTE) SARS-CoV-2 target nucleic acids are NOT DETECTED.  The SARS-CoV-2 RNA is generally detectable in upper respiratory specimens during the acute phase of infection. The lowest concentration of SARS-CoV-2 viral copies this assay can detect is 138 copies/mL. A negative result does not preclude SARS-Cov-2 infection and should not be used as the sole basis for treatment or other patient management decisions. A negative result  may occur with  improper specimen collection/handling, submission of specimen other than nasopharyngeal swab, presence of viral mutation(s) within the areas targeted by this assay, and inadequate number of viral copies(<138 copies/mL). A negative result must be combined with clinical observations, patient history, and epidemiological information. The expected result is Negative.  Fact Sheet for Patients:  BloggerCourse.com  Fact Sheet for Healthcare Providers:  SeriousBroker.it  This test is no t yet approved or cleared by the [place] FDA and  has been authorized for detection and/or diagnosis of SARS-CoV-2 by FDA under an Emergency Use Authorization (EUA). This EUA will remain  in effect (meaning this test can be used) for the duration of the COVID-19 declaration under Section 564(b)(1) of the Act, 21 U.S.C.section 360bbb-3(b)(1), unless the authorization is terminated  or revoked sooner.       Influenza A by PCR NEGATIVE NEGATIVE Final   Influenza B by PCR  NEGATIVE NEGATIVE Final    Comment: (NOTE) The Xpert Xpress SARS-CoV-2/FLU/RSV plus assay is intended as an aid in the diagnosis of influenza from Nasopharyngeal swab specimens and should not be used as a sole basis for treatment. Nasal washings and aspirates are unacceptable for Xpert Xpress SARS-CoV-2/FLU/RSV testing.  Fact Sheet for Patients: BloggerCourse.com  Fact Sheet for Healthcare Providers: SeriousBroker.it  This test is not yet approved or cleared by the [place] FDA and has been authorized for detection and/or diagnosis of SARS-CoV-2 by FDA under an Emergency Use Authorization (EUA). This EUA will remain in effect (meaning this test can be used) for the duration of the COVID-19 declaration under Section 564(b)(1) of the Act, 21 U.S.C. section 360bbb-3(b)(1), unless the authorization is terminated  or revoked.     Resp Syncytial Virus by PCR NEGATIVE NEGATIVE Final    Comment: (NOTE) Fact Sheet for Patients: BloggerCourse.com  Fact Sheet for Healthcare Providers: SeriousBroker.it  This test is not yet approved or cleared by the [place] FDA and has been authorized for detection and/or diagnosis of SARS-CoV-2 by FDA under an Emergency Use Authorization (EUA). This EUA will remain in effect (meaning this test can be used) for the duration of the COVID-19 declaration under Section 564(b)(1) of the Act, 21 U.S.C. section 360bbb-3(b)(1), unless the authorization is terminated or revoked.  Performed at [facility], [address] [place], [place] [address]   Blood Culture (routine x 2)     Status: None (Preliminary result)   Collection Time: 01/26/23 12:20 PM   Specimen: BLOOD  Result Value Ref Range Status   Specimen Description BLOOD BLOOD LEFT FOREARM  Final   Special Requests   Final    BOTTLES DRAWN AEROBIC AND ANAEROBIC Blood Culture adequate volume   Culture   Final    NO GROWTH 2 DAYS Performed at [facility], [address] [place], [place] [address]    Report Status PENDING  Incomplete  Blood Culture (routine x 2)     Status: None (Preliminary result)   Collection Time: 01/26/23 12:20 PM   Specimen: BLOOD  Result Value Ref Range Status   Specimen Description BLOOD LEFT ANTECUBITAL  Final   Special Requests   Final    BOTTLES DRAWN AEROBIC AND ANAEROBIC Blood Culture adequate volume   Culture   Final    NO GROWTH 2 DAYS Performed at [facility], [address], [place], [place] [address]    Report Status PENDING  Incomplete  Respiratory (~20 pathogens) panel by PCR     Status: None   Collection Time: 01/26/23  4:22 PM   Specimen: Nasopharyngeal Swab; Respiratory  Result Value Ref Range Status   Adenovirus NOT DETECTED NOT DETECTED Final   Coronavirus 229E NOT DETECTED  NOT DETECTED Final    Comment: (NOTE) The Coronavirus on the Respiratory Panel, DOES NOT test for the novel  Coronavirus (2019 nCoV)    Coronavirus HKU1 NOT DETECTED NOT DETECTED Final   Coronavirus NL63 NOT DETECTED NOT DETECTED Final   Coronavirus OC43 NOT DETECTED NOT DETECTED Final   Metapneumovirus NOT DETECTED NOT DETECTED Final   Rhinovirus / Enterovirus NOT DETECTED NOT DETECTED Final   Influenza A NOT DETECTED NOT DETECTED Final   Influenza B NOT DETECTED NOT DETECTED Final   Parainfluenza Virus 1 NOT DETECTED NOT DETECTED Final   Parainfluenza Virus 2 NOT DETECTED NOT DETECTED Final   Parainfluenza Virus 3 NOT DETECTED NOT DETECTED Final   Parainfluenza Virus 4 NOT  DETECTED NOT DETECTED Final   Respiratory Syncytial Virus NOT DETECTED NOT DETECTED Final   Bordetella pertussis NOT DETECTED NOT DETECTED Final   Bordetella Parapertussis NOT DETECTED NOT DETECTED Final   Chlamydophila pneumoniae NOT DETECTED NOT DETECTED Final   Mycoplasma pneumoniae NOT DETECTED NOT DETECTED Final    Comment: Performed at [facility], [address] [place], [place] [address]         Radiology Studies: DG Chest 2 View Result Date: 01/26/2023 CLINICAL DATA:  Cough.  Chest congestion. EXAM: CHEST - 2 VIEW COMPARISON:  Chest radiograph dated September 16, 2022. FINDINGS: The heart size and mediastinal contours are within normal limits. Aortic atherosclerosis. Streaky right infrahilar and patchy left middle and lower lung zone opacities. No pleural effusion or pneumothorax. No acute osseous abnormality. IMPRESSION: Streaky right infrahilar and left middle and lower lung zone opacities may be secondary to multifocal pneumonia/atypical pneumonia. Electronically Signed   By: Hart Robinsons M.D.   On: 01/26/2023 13:41           LOS: 2 days   Time spent= 35 mins    Miguel Rota, MD [facility]  If 7PM-7AM, please contact night-coverage  01/28/2023, 11:13 AM

## 2023-01-28 NOTE — TOC Initial Note (Signed)
Transition of Care [facility]) - Initial/Assessment Note    Patient Details  Name: Shelley Thomas MRN: [id number] Date of Birth: Jan 14, 1941  [facility] CM/SW Contact:    Liliana Cline, LCSW Phone Number: 01/28/2023, 12:03 PM  Clinical Narrative:                 CSW spoke with patient at bedside. Patient is from home with her spouse. Patient is able to drive. Spouse to transport home when discharged. PCP is Dr. Arlana Pouch. [facility].  CSW explained recommendation for Home Health, patient refused. Patient stated she and her husband are very active and exercise at home daily. Patient states she does not use any DME (including oxygen) at home and does not want to use any DME at home.   Expected Discharge Plan: Home/Self Care Barriers to Discharge: Continued Medical Work up   Patient Goals and CMS Choice Patient states their goals for this hospitalization and ongoing recovery are:: to return home, declines home health          Expected Discharge Plan and Services       Living arrangements for the past 2 months: Single Family Home                                      Prior Living Arrangements/Services Living arrangements for the past 2 months: Single Family Home Lives with:: Spouse Patient language and need for interpreter reviewed:: Yes Do you feel safe going back to the place where you live?: Yes      Need for Family Participation in Patient Care: Yes (Comment) Care giver support system in place?: Yes (comment)   Criminal Activity/Legal Involvement Pertinent to Current Situation/Hospitalization: No - Comment as needed  Activities of Daily Living   ADL Screening (condition at time of admission) Independently performs ADLs?: Yes (appropriate for developmental age) Is the patient deaf or have difficulty hearing?: Yes Does the patient have difficulty seeing, even when wearing glasses/contacts?: No Does the patient have difficulty  concentrating, remembering, or making decisions?: No  Permission Sought/Granted                  Emotional Assessment       Orientation: : Oriented to Self, Oriented to Place, Oriented to  Time, Oriented to Situation Alcohol / Substance Use: Not Applicable Psych Involvement: No (comment)  Admission diagnosis:  Hypoxia [R09.02] COPD exacerbation (HCC) [J44.1] CAP (community acquired pneumonia) [J18.9] Multifocal pneumonia [J18.9] 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 [place] [address] Phone: [phone] Fax: [phone]  [id number], [place] - [address] [place] [address] Phone: [phone] Fax: [phone]  Social Drivers of Health (SDOH) Social History: SDOH Screenings   Food Insecurity: No Food Insecurity (01/27/2023)  Housing: Unknown (01/27/2023)  Transportation Needs: Unknown (01/27/2023)  Utilities: Not At Risk (01/27/2023)  Tobacco Use: High Risk (01/26/2023)   SDOH Interventions:     Readmission Risk Interventions    01/28/2023   12:02 PM  Readmission Risk Prevention Plan  Post Dischage Appt Complete  Medication Screening Complete  Transportation Screening Complete

## 2023-01-29 DIAGNOSIS — J189 Pneumonia, unspecified organism: Secondary | ICD-10-CM | POA: Diagnosis not present

## 2023-01-29 LAB — BASIC METABOLIC PANEL
Anion gap: 7 (ref 5–15)
BUN: 31 mg/dL — ABNORMAL HIGH (ref 8–23)
CO2: 26 mmol/L (ref 22–32)
Calcium: 8.9 mg/dL (ref 8.9–10.3)
Chloride: 109 mmol/L (ref 98–111)
Creatinine, Ser: 0.87 mg/dL (ref 0.44–1.00)
GFR, Estimated: >60 mL/min (ref >60)
Glucose, Bld: 86 mg/dL (ref 70–99)
Potassium: 4.2 mmol/L (ref 3.5–5.1)
Sodium: 142 mmol/L (ref 135–145)

## 2023-01-29 LAB — CBC
HCT: 30.9 % — ABNORMAL LOW (ref 36.0–46.0)
Hemoglobin: 10.1 g/dL — ABNORMAL LOW (ref 12.0–15.0)
MCH: 30.1 pg (ref 26.0–34.0)
MCHC: 32.7 g/dL (ref 30.0–36.0)
MCV: 92 fL (ref 80.0–100.0)
Platelets: 201 10*3/uL (ref 150–400)
RBC: 3.36 MIL/uL — ABNORMAL LOW (ref 3.87–5.11)
RDW: 14 % (ref 11.5–15.5)
WBC: 8.6 10*3/uL (ref 4.0–10.5)
nRBC: 0 % (ref 0.0–0.2)

## 2023-01-29 LAB — MAGNESIUM: Magnesium: 1.8 mg/dL (ref 1.7–2.4)

## 2023-01-29 MED ORDER — HYDROCHLOROTHIAZIDE 12.5 MG PO TABS
12.5000 mg | ORAL_TABLET | Freq: Every day | ORAL | Status: DC
  Administered 2023-01-29 – 2023-01-30 (×2): 12.5 mg via ORAL
  Filled 2023-01-29 (×2): qty 1

## 2023-01-29 MED ORDER — ACETAMINOPHEN 325 MG PO TABS
650.0000 mg | ORAL_TABLET | Freq: Four times a day (QID) | ORAL | Status: DC | PRN
  Administered 2023-01-29 – 2023-01-30 (×2): 650 mg via ORAL
  Filled 2023-01-29 (×2): qty 2

## 2023-01-29 NOTE — Plan of Care (Signed)

## 2023-01-29 NOTE — Plan of Care (Signed)
  Problem: Clinical Measurements: Goal: Ability to maintain clinical measurements within normal limits will improve Outcome: Progressing Goal: Respiratory complications will improve Outcome: Progressing   Problem: Activity: Goal: Risk for activity intolerance will decrease Outcome: Progressing   Problem: Nutrition: Goal: Adequate nutrition will be maintained Outcome: Progressing   Problem: Coping: Goal: Level of anxiety will decrease Outcome: Progressing   

## 2023-01-29 NOTE — Progress Notes (Addendum)
Progress Note    Shelley Thomas  OZH:[id number] DOB: Jun 02, 1940  DOA: 01/26/2023 PCP: Jaclyn Shaggy, MD      Brief Narrative:    Medical records reviewed and are as summarized below:  Shelley Thomas is a 82 y.o. female with history of HTN, HLD, COPD presents to the ED with shortness of breath ongoing for about 2 to 3 days duration.  Upon admission diagnosed with multifocal community-acquired pneumonia and COPD exacerbation. Patient started on bronchodilators, antibiotics and steroids. COVID, RSV, flu, respiratory panel is negative.      Assessment/Plan:   Principal Problem:   CAP (community acquired pneumonia) Active Problems:   COPD with acute exacerbation (HCC)   CKD (chronic kidney disease), stage III (HCC)   Hypertension   Nutrition Problem: Increased nutrient needs Etiology: catabolic illness, chronic illness (COPD)  Signs/Symptoms: estimated needs   Body mass index is 19.56 kg/m.   CAP/COPD exacerbation Continue prednisone, IV ceftriaxone and oral azithromycin.  Continue incentive spirometry as needed.  Continue bronchodilators. COVID, RSV, flu, respiratory panel is negative   Acute hypoxemic respiratory failure, probable chronic hypoxemic respiratory failure from COPD Oxygen saturation was 88% on room air at rest.  With ambulation, oxygen saturation was 85% and it improved to 91% on 4 L/min oxygen via [place].  Patient was told she may need home oxygen.  However, she said she will not go home with oxygen.  She understands that she is at risk for complications including but not limited to cardiopulmonary arrest, stroke, MI from untreated respiratory failure.    CKD stage II Creatinine around 1.0.  Closely monitor.    Essential hypertension, stable Resume HCTZ.      Diet Order             Diet regular Room service appropriate? Yes; Fluid consistency: Thin  Diet effective now                             Consultants: None  Procedures: None    Medications:    azithromycin  500 mg Oral Daily   budesonide (PULMICORT) nebulizer solution  0.5 mg Nebulization BID   enoxaparin (LOVENOX) injection  40 mg Subcutaneous Q24H   fenofibrate  160 mg Oral Daily   guaiFENesin  600 mg Oral BID   hydrochlorothiazide  12.5 mg Oral Daily   predniSONE  40 mg Oral Q breakfast   Continuous Infusions:  cefTRIAXone (ROCEPHIN)  IV 2 g (01/29/23 1211)     Anti-infectives (From admission, onward)    Start     Dose/Rate Route Frequency Ordered Stop   01/28/23 1000  azithromycin (ZITHROMAX) tablet 500 mg        500 mg Oral Daily 01/27/23 0856 01/31/23 0959   01/26/23 1200  cefTRIAXone (ROCEPHIN) 2 g in sodium chloride 0.9 % 100 mL IVPB        2 g 200 mL/hr over 30 Minutes Intravenous Every 24 hours 01/26/23 1159 01/31/23 1159   01/26/23 1200  azithromycin (ZITHROMAX) 500 mg in sodium chloride 0.9 % 250 mL IVPB        500 mg 250 mL/hr over 60 Minutes Intravenous Every 24 hours 01/26/23 1159 01/27/23 1437              Family Communication/Anticipated D/C date and plan/Code Status   DVT prophylaxis: enoxaparin (LOVENOX) injection 40 mg Start: 01/26/23 2200     Code Status: Full Code  Family  Communication: None Disposition Plan: Plan to discharge home   Status is: Inpatient Remains inpatient appropriate because: Pneumonia and COPD exacerbation       Subjective:   Interval events noted.  She feels better but short of breath with exertion.  Objective:    Vitals:   01/29/23 0800 01/29/23 0930 01/29/23 0935 01/29/23 0947  BP: (!) 156/98     Pulse: 84     Resp: 18     Temp: 98.2 F (36.8 C)     TempSrc:      SpO2: 100% (!) 88% (!) 85% 91%  Weight:      Height:       No data found.   Intake/Output Summary (Last 24 hours) at 01/29/2023 1604 Last data filed at 01/29/2023 1610 Gross per 24 hour  Intake 200 ml  Output --  Net 200 ml   Filed  Weights   01/26/23 1139  Weight: 48.5 kg    Exam:  GEN: NAD SKIN: Warm and dry EYES: No pallor or icterus ENT: MMM CV: RRR PULM: Decreased air entry bilaterally.  Bilateral rhonchi at the lung bases ABD: soft, ND, NT, +BS CNS: AAO x 3, non focal EXT: No edema or tenderness        Data Reviewed:   I have personally reviewed following labs and imaging studies:  Labs: Labs show the following:   Basic Metabolic Panel: Recent Labs  Lab 01/26/23 1141 01/27/23 0421 01/28/23 0240 01/29/23 0646  NA 136 139 140 142  K 3.7 3.7 4.0 4.2  CL 100 108 109 109  CO2 27 24 24 26   GLUCOSE 150* 173* 147* 86  BUN 31* 39* 40* 31*  CREATININE 1.11* 0.99 0.89 0.87  CALCIUM 9.3 8.8* 8.9 8.9  MG  --   --  2.0 1.8   GFR Estimated Creatinine Clearance: 38.8 mL/min (by C-G formula based on SCr of 0.87 mg/dL). Liver Function Tests: Recent Labs  Lab 01/26/23 1141  AST 21  ALT 14  ALKPHOS 34*  BILITOT 0.9  PROT 7.7  ALBUMIN 3.8   No results for input(s): "LIPASE", "AMYLASE" in the last 168 hours. No results for input(s): "AMMONIA" in the last 168 hours. Coagulation profile Recent Labs  Lab 01/26/23 1220  INR 1.1    CBC: Recent Labs  Lab 01/26/23 1141 01/27/23 0421 01/28/23 0240 01/29/23 0646  WBC 12.1* 10.7* 11.0* 8.6  NEUTROABS  --  9.0*  --   --   HGB 12.5 10.6* 10.3* 10.1*  HCT 38.6 32.2* 32.0* 30.9*  MCV 91.9 90.2 93.0 92.0  PLT 173 158 176 201   Cardiac Enzymes: No results for input(s): "CKTOTAL", "CKMB", "CKMBINDEX", "TROPONINI" in the last 168 hours. BNP (last 3 results) No results for input(s): "PROBNP" in the last 8760 hours. CBG: No results for input(s): "GLUCAP" in the last 168 hours. D-Dimer: No results for input(s): "DDIMER" in the last 72 hours. Hgb A1c: No results for input(s): "HGBA1C" in the last 72 hours. Lipid Profile: No results for input(s): "CHOL", "HDL", "LDLCALC", "TRIG", "CHOLHDL", "LDLDIRECT" in the last 72 hours. Thyroid function  studies: No results for input(s): "TSH", "T4TOTAL", "T3FREE", "THYROIDAB" in the last 72 hours.  Invalid input(s): "FREET3" Anemia work up: No results for input(s): "VITAMINB12", "FOLATE", "FERRITIN", "TIBC", "IRON", "RETICCTPCT" in the last 72 hours. Sepsis Labs: Recent Labs  Lab 01/26/23 1141 01/26/23 1220 01/27/23 0421 01/28/23 0240 01/29/23 0646  PROCALCITON 0.20  --   --   --   --  WBC 12.1*  --  10.7* 11.0* 8.6  LATICACIDVEN  --  1.8  --   --   --     Microbiology Recent Results (from the past 240 hours)  Resp panel by RT-PCR (RSV, Flu A&B, Covid) Anterior Nasal Swab     Status: None   Collection Time: 01/26/23 12:20 PM   Specimen: Anterior Nasal Swab  Result Value Ref Range Status   SARS Coronavirus 2 by RT PCR NEGATIVE NEGATIVE Final    Comment: (NOTE) SARS-CoV-2 target nucleic acids are NOT DETECTED.  The SARS-CoV-2 RNA is generally detectable in upper respiratory specimens during the acute phase of infection. The lowest concentration of SARS-CoV-2 viral copies this assay can detect is 138 copies/mL. A negative result does not preclude SARS-Cov-2 infection and should not be used as the sole basis for treatment or other patient management decisions. A negative result may occur with  improper specimen collection/handling, submission of specimen other than nasopharyngeal swab, presence of viral mutation(s) within the areas targeted by this assay, and inadequate number of viral copies(<138 copies/mL). A negative result must be combined with clinical observations, patient history, and epidemiological information. The expected result is Negative.  Fact Sheet for Patients:  BloggerCourse.com  Fact Sheet for Healthcare Providers:  SeriousBroker.it  This test is no t yet approved or cleared by the [place] FDA and  has been authorized for detection and/or diagnosis of SARS-CoV-2 by FDA under an Emergency Use  Authorization (EUA). This EUA will remain  in effect (meaning this test can be used) for the duration of the COVID-19 declaration under Section 564(b)(1) of the Act, 21 U.S.C.section 360bbb-3(b)(1), unless the authorization is terminated  or revoked sooner.       Influenza A by PCR NEGATIVE NEGATIVE Final   Influenza B by PCR NEGATIVE NEGATIVE Final    Comment: (NOTE) The Xpert Xpress SARS-CoV-2/FLU/RSV plus assay is intended as an aid in the diagnosis of influenza from Nasopharyngeal swab specimens and should not be used as a sole basis for treatment. Nasal washings and aspirates are unacceptable for Xpert Xpress SARS-CoV-2/FLU/RSV testing.  Fact Sheet for Patients: BloggerCourse.com  Fact Sheet for Healthcare Providers: SeriousBroker.it  This test is not yet approved or cleared by the [place] FDA and has been authorized for detection and/or diagnosis of SARS-CoV-2 by FDA under an Emergency Use Authorization (EUA). This EUA will remain in effect (meaning this test can be used) for the duration of the COVID-19 declaration under Section 564(b)(1) of the Act, 21 U.S.C. section 360bbb-3(b)(1), unless the authorization is terminated or revoked.     Resp Syncytial Virus by PCR NEGATIVE NEGATIVE Final    Comment: (NOTE) Fact Sheet for Patients: BloggerCourse.com  Fact Sheet for Healthcare Providers: SeriousBroker.it  This test is not yet approved or cleared by the [place] FDA and has been authorized for detection and/or diagnosis of SARS-CoV-2 by FDA under an Emergency Use Authorization (EUA). This EUA will remain in effect (meaning this test can be used) for the duration of the COVID-19 declaration under Section 564(b)(1) of the Act, 21 U.S.C. section 360bbb-3(b)(1), unless the authorization is terminated or revoked.  Performed at [facility], [address] [place], [place] [address]   Blood Culture (routine x 2)     Status: None (Preliminary result)   Collection Time: 01/26/23 12:20 PM   Specimen: BLOOD  Result Value Ref Range Status   Specimen Description BLOOD BLOOD LEFT FOREARM  Final   Special Requests  Final    BOTTLES DRAWN AEROBIC AND ANAEROBIC Blood Culture adequate volume   Culture   Final    NO GROWTH 3 DAYS Performed at [facility], [address] [place], [place] [address]    Report Status PENDING  Incomplete  Blood Culture (routine x 2)     Status: None (Preliminary result)   Collection Time: 01/26/23 12:20 PM   Specimen: BLOOD  Result Value Ref Range Status   Specimen Description BLOOD LEFT ANTECUBITAL  Final   Special Requests   Final    BOTTLES DRAWN AEROBIC AND ANAEROBIC Blood Culture adequate volume   Culture   Final    NO GROWTH 3 DAYS Performed at [facility], [address], [place], [place] [address]    Report Status PENDING  Incomplete  Respiratory (~20 pathogens) panel by PCR     Status: None   Collection Time: 01/26/23  4:22 PM   Specimen: Nasopharyngeal Swab; Respiratory  Result Value Ref Range Status   Adenovirus NOT DETECTED NOT DETECTED Final   Coronavirus 229E NOT DETECTED NOT DETECTED Final    Comment: (NOTE) The Coronavirus on the Respiratory Panel, DOES NOT test for the novel  Coronavirus (2019 nCoV)    Coronavirus HKU1 NOT DETECTED NOT DETECTED Final   Coronavirus NL63 NOT DETECTED NOT DETECTED Final   Coronavirus OC43 NOT DETECTED NOT DETECTED Final   Metapneumovirus NOT DETECTED NOT DETECTED Final   Rhinovirus / Enterovirus NOT DETECTED NOT DETECTED Final   Influenza A NOT DETECTED NOT DETECTED Final   Influenza B NOT DETECTED NOT DETECTED Final   Parainfluenza Virus 1 NOT DETECTED NOT DETECTED Final   Parainfluenza Virus 2 NOT DETECTED NOT DETECTED Final   Parainfluenza Virus 3 NOT DETECTED NOT DETECTED Final   Parainfluenza Virus 4 NOT DETECTED NOT DETECTED  Final   Respiratory Syncytial Virus NOT DETECTED NOT DETECTED Final   Bordetella pertussis NOT DETECTED NOT DETECTED Final   Bordetella Parapertussis NOT DETECTED NOT DETECTED Final   Chlamydophila pneumoniae NOT DETECTED NOT DETECTED Final   Mycoplasma pneumoniae NOT DETECTED NOT DETECTED Final    Comment: Performed at [facility], [address] [place], [place] [address]    Procedures and diagnostic studies:  No results found.             LOS: 3 days   Adriell Polansky  [facility]   Pager on [email or web address]. If 7PM-7AM, please contact night-coverage at [email or web address]     01/29/2023, 4:04 PM

## 2023-01-29 NOTE — Care Management Important Message (Signed)
Important Message  Patient Details  Name: Shelley Thomas MRN: [id number] Date of Birth: 28-Aug-1940   Important Message Given:  Yes - Medicare IM     Bernadette Hoit 01/29/2023, 11:54 AM

## 2023-01-30 DIAGNOSIS — J189 Pneumonia, unspecified organism: Secondary | ICD-10-CM | POA: Diagnosis not present

## 2023-01-30 LAB — CBC
HCT: 32.4 % — ABNORMAL LOW (ref 36.0–46.0)
Hemoglobin: 10.6 g/dL — ABNORMAL LOW (ref 12.0–15.0)
MCH: 30.3 pg (ref 26.0–34.0)
MCHC: 32.7 g/dL (ref 30.0–36.0)
MCV: 92.6 fL (ref 80.0–100.0)
Platelets: 215 10*3/uL (ref 150–400)
RBC: 3.5 MIL/uL — ABNORMAL LOW (ref 3.87–5.11)
RDW: 13.9 % (ref 11.5–15.5)
WBC: 6.7 10*3/uL (ref 4.0–10.5)
nRBC: 0 % (ref 0.0–0.2)

## 2023-01-30 LAB — BASIC METABOLIC PANEL
Anion gap: 6 (ref 5–15)
BUN: 30 mg/dL — ABNORMAL HIGH (ref 8–23)
CO2: 30 mmol/L (ref 22–32)
Calcium: 8.9 mg/dL (ref 8.9–10.3)
Chloride: 102 mmol/L (ref 98–111)
Creatinine, Ser: 0.82 mg/dL (ref 0.44–1.00)
GFR, Estimated: >60 mL/min (ref >60)
Glucose, Bld: 110 mg/dL — ABNORMAL HIGH (ref 70–99)
Potassium: 4.5 mmol/L (ref 3.5–5.1)
Sodium: 138 mmol/L (ref 135–145)

## 2023-01-30 LAB — MAGNESIUM: Magnesium: 1.8 mg/dL (ref 1.7–2.4)

## 2023-01-30 MED ORDER — ENSURE ENLIVE PO LIQD
237.0000 mL | Freq: Three times a day (TID) | ORAL | Status: DC
  Administered 2023-01-30: 237 mL via ORAL

## 2023-01-30 MED ORDER — LISINOPRIL 20 MG PO TABS
20.0000 mg | ORAL_TABLET | Freq: Every day | ORAL | Status: DC
Start: 2023-01-30 — End: 2023-01-30
  Administered 2023-01-30: 20 mg via ORAL
  Filled 2023-01-30: qty 1

## 2023-01-30 MED ORDER — ADULT MULTIVITAMIN W/MINERALS CH
1.0000 | ORAL_TABLET | Freq: Every day | ORAL | Status: DC
  Administered 2023-01-30: 1 via ORAL

## 2023-01-30 NOTE — Progress Notes (Addendum)
Patient Saturations on Room Air at Rest = 91%  Patient Saturations on [employer] while Ambulating = 87 %  Patient Saturations on 2 Liters of oxygen while Ambulating = 92%  Please briefly explain why patient needs home oxygen: Patient's oxygen desaturated while ambulating. To recover patient, portable oxygen was increased from RA to 2L O2 to sustain at 92%.   Madie Reno, RN

## 2023-01-30 NOTE — Progress Notes (Signed)
Nutrition Follow-up  DOCUMENTATION CODES:   Not applicable  INTERVENTION:   -Ensure Enlive po TID, each supplement provides 350 kcal and 20 grams of protein -MVI with minerals daily -Continue regular diet  NUTRITION DIAGNOSIS:   Increased nutrient needs related to catabolic illness, chronic illness (COPD) as evidenced by estimated needs.  GOAL:   Patient will meet greater than or equal to 90% of their needs  MONITOR:   PO intake, Supplement acceptance, Weight trends  REASON FOR ASSESSMENT:   Consult Assessment of nutrition requirement/status  ASSESSMENT:   82 year old with history of HTN, HLD, COPD who presented with shortness of breath ongoing for past 2-3 days.  Upon admission diagnosed with multifocal community-acquired pneumonia and COPD exacerbation  Reviewed I/O's: +300 ml x 24 hours and +2 L since admission  Pt unavailable at time of visit. Attempted to speak with pt via call to hospital room phone, however, unable to reach. RD unable to obtain further nutrition-related history or complete nutrition-focused physical exam at this time.    Pt currently on a regular diet. Meal completion data limited, but noted 0% of breakfast this morning. No Ensure order noted, however, pt was agreeable on initial assessment, so will order.  No new wt since last visit.   Per MD notes, COVID, RSV, flu, and respiratory panel was negative.   Medications reviewed and include pulmicort, fenofibrate, mucinex, rocephin, and prednisone.   Per TOC notes, home health services recommended for pt, however, pt refused.  Labs reviewed.   Diet Order:   Diet Order             Diet regular Room service appropriate? Yes; Fluid consistency: Thin  Diet effective now                   EDUCATION NEEDS:   Education needs have been addressed  Skin:  Skin Assessment: Reviewed RN Assessment  Last BM:  01/29/23  Height:   Ht Readings from Last 1 Encounters:  01/26/23 5\' 2"  (1.575  m)    Weight:   Wt Readings from Last 1 Encounters:  01/26/23 48.5 kg   BMI:  Body mass index is 19.56 kg/m.  Estimated Nutritional Needs:   Kcal:  1500-1700  Protein:  75-90 grams  Fluid:  > 1.5 L    Levada Schilling, RD, LDN, CDCES Registered Dietitian III Certified Diabetes Care and Education Specialist If unable to reach this RD, please use "RD Inpatient" group chat on secure chat between hours of 8am-4 pm daily

## 2023-01-30 NOTE — Progress Notes (Signed)
Home Oxygen delivered to patients room. Patient give discharge instructions. Patient has no further questions at this time. Patient assisted in wheelchair down to medical mall for discharge.   Madie Reno, RN

## 2023-01-30 NOTE — Discharge Summary (Signed)
Physician Discharge Summary   Patient: Shelley Thomas MRN: [id number] DOB: [birth year]  Admit date:     01/26/2023  Discharge date: 01/30/23  Discharge Physician: Lurene Shadow   PCP: Jaclyn Shaggy, MD   Recommendations at discharge:   Follow-up with PCP in 1 week  Discharge Diagnoses: Principal Problem:   CAP (community acquired pneumonia) Active Problems:   COPD with acute exacerbation (HCC)   CKD (chronic kidney disease), stage III (HCC)   Hypertension   Acute on chronic respiratory failure with hypoxia (HCC)  Resolved Problems:   * No resolved hospital problems. *  Hospital Course:  Simranjit Cherrington is a 82 y.o. female with history of HTN, HLD, COPD presents to the ED with shortness of breath ongoing for about 2 to 3 days duration.  Upon admission diagnosed with multifocal community-acquired pneumonia and COPD exacerbation. Patient started on bronchodilators, antibiotics and steroids. COVID, RSV, flu, respiratory panel is negative.     Assessment and Plan:  CAP/COPD exacerbation She completed course of steroids and antibiotics in the hospital. COVID, RSV, flu, respiratory panel were negative     Acute hypoxemic respiratory failure, probable chronic hypoxemic respiratory failure from COPD Oxygen saturation on room air at rest was 91%, oxygen saturation on room air with ambulation was 87% and oxygen saturation on 2 L of oxygen while ambulating was 92%.  She will be discharged on 2 L/min oxygen.  She is agreeable with this plan.     CKD stage II Creatinine is stable     Essential hypertension, stable Continue HCTZ and lisinopril     Her condition has improved and she is deemed stable for discharge to home.  She was very appreciative of the care she received in the hospital.        Consultants: None Procedures performed: None Disposition: Home Diet recommendation:  Discharge Diet Orders (From admission, onward)     Start     Ordered   01/30/23  0000  Diet - low sodium heart healthy        01/30/23 1306           Cardiac diet DISCHARGE MEDICATION: Allergies as of 01/30/2023   No Known Allergies      Medication List     STOP taking these medications    nicotine 14 mg/24hr patch Commonly known as: NICODERM CQ - dosed in mg/24 hours   predniSONE 10 MG (21) Tbpk tablet Commonly known as: STERAPRED UNI-PAK 21 TAB       TAKE these medications    albuterol 108 (90 Base) MCG/ACT inhaler Commonly known as: VENTOLIN HFA Inhale 2 puffs into the lungs every 6 (six) hours as needed for wheezing or shortness of breath.   budesonide-formoterol 80-4.5 MCG/ACT inhaler Commonly known as: Symbicort Inhale 2 puffs into the lungs daily.   fenofibrate 145 MG tablet Commonly known as: TRICOR Take 145 mg by mouth daily.   hydrochlorothiazide 12.5 MG tablet Commonly known as: HYDRODIURIL Take 12.5 mg by mouth every morning.   lisinopril 20 MG tablet Commonly known as: ZESTRIL Take 20 mg by mouth 2 (two) times daily.   meclizine 25 MG tablet Commonly known as: ANTIVERT Take 25 mg by mouth every 8 (eight) hours as needed.   meloxicam 15 MG tablet Commonly known as: MOBIC Take 15 mg by mouth daily.   Premarin 0.3 MG tablet Generic drug: estrogens (conjugated) Take 1-2 tablets by mouth once a week.  Durable Medical Equipment  (From admission, onward)           Start     Ordered   01/30/23 0000  For home use only DME oxygen       Question Answer Comment  Length of Need Lifetime   Mode or (Route) Nasal cannula   Liters per Minute 2   Frequency Continuous (stationary and portable oxygen unit needed)   Oxygen conserving device Yes   Oxygen delivery system Gas      01/30/23 1048            Discharge Exam: Filed Weights   01/26/23 1139  Weight: 48.5 kg   GEN: NAD SKIN: Warm and dry EYES: No pallor or icterus ENT: MMM CV: RRR PULM: CTA B ABD: soft, ND, NT, +BS CNS: AAO x 3,  non focal EXT: No edema or tenderness   Condition at discharge: good  The results of significant diagnostics from this hospitalization (including imaging, microbiology, ancillary and laboratory) are listed below for reference.   Imaging Studies: DG Chest 2 View Result Date: 01/26/2023 CLINICAL DATA:  Cough.  Chest congestion. EXAM: CHEST - 2 VIEW COMPARISON:  Chest radiograph dated September 16, 2022. FINDINGS: The heart size and mediastinal contours are within normal limits. Aortic atherosclerosis. Streaky right infrahilar and patchy left middle and lower lung zone opacities. No pleural effusion or pneumothorax. No acute osseous abnormality. IMPRESSION: Streaky right infrahilar and left middle and lower lung zone opacities may be secondary to multifocal pneumonia/atypical pneumonia. Electronically Signed   By: Hart Robinsons M.D.   On: 01/26/2023 13:41    Microbiology: Results for orders placed or performed during the hospital encounter of 01/26/23  Resp panel by RT-PCR (RSV, Flu A&B, Covid) Anterior Nasal Swab     Status: None   Collection Time: 01/26/23 12:20 PM   Specimen: Anterior Nasal Swab  Result Value Ref Range Status   SARS Coronavirus 2 by RT PCR NEGATIVE NEGATIVE Final    Comment: (NOTE) SARS-CoV-2 target nucleic acids are NOT DETECTED.  The SARS-CoV-2 RNA is generally detectable in upper respiratory specimens during the acute phase of infection. The lowest concentration of SARS-CoV-2 viral copies this assay can detect is 138 copies/mL. A negative result does not preclude SARS-Cov-2 infection and should not be used as the sole basis for treatment or other patient management decisions. A negative result may occur with  improper specimen collection/handling, submission of specimen other than nasopharyngeal swab, presence of viral mutation(s) within the areas targeted by this assay, and inadequate number of viral copies(<138 copies/mL). A negative result must be combined  with clinical observations, patient history, and epidemiological information. The expected result is Negative.  Fact Sheet for Patients:  BloggerCourse.com  Fact Sheet for Healthcare Providers:  SeriousBroker.it  This test is no t yet approved or cleared by the [place] FDA and  has been authorized for detection and/or diagnosis of SARS-CoV-2 by FDA under an Emergency Use Authorization (EUA). This EUA will remain  in effect (meaning this test can be used) for the duration of the COVID-19 declaration under Section 564(b)(1) of the Act, 21 U.S.C.section 360bbb-3(b)(1), unless the authorization is terminated  or revoked sooner.       Influenza A by PCR NEGATIVE NEGATIVE Final   Influenza B by PCR NEGATIVE NEGATIVE Final    Comment: (NOTE) The Xpert Xpress SARS-CoV-2/FLU/RSV plus assay is intended as an aid in the diagnosis of influenza from Nasopharyngeal swab specimens and should not be used as  a sole basis for treatment. Nasal washings and aspirates are unacceptable for Xpert Xpress SARS-CoV-2/FLU/RSV testing.  Fact Sheet for Patients: BloggerCourse.com  Fact Sheet for Healthcare Providers: SeriousBroker.it  This test is not yet approved or cleared by the [place] FDA and has been authorized for detection and/or diagnosis of SARS-CoV-2 by FDA under an Emergency Use Authorization (EUA). This EUA will remain in effect (meaning this test can be used) for the duration of the COVID-19 declaration under Section 564(b)(1) of the Act, 21 U.S.C. section 360bbb-3(b)(1), unless the authorization is terminated or revoked.     Resp Syncytial Virus by PCR NEGATIVE NEGATIVE Final    Comment: (NOTE) Fact Sheet for Patients: BloggerCourse.com  Fact Sheet for Healthcare Providers: SeriousBroker.it  This test is not yet approved  or cleared by the [place] FDA and has been authorized for detection and/or diagnosis of SARS-CoV-2 by FDA under an Emergency Use Authorization (EUA). This EUA will remain in effect (meaning this test can be used) for the duration of the COVID-19 declaration under Section 564(b)(1) of the Act, 21 U.S.C. section 360bbb-3(b)(1), unless the authorization is terminated or revoked.  Performed at [facility], [address] [place], [place] [address]   Blood Culture (routine x 2)     Status: None (Preliminary result)   Collection Time: 01/26/23 12:20 PM   Specimen: BLOOD  Result Value Ref Range Status   Specimen Description BLOOD BLOOD LEFT FOREARM  Final   Special Requests   Final    BOTTLES DRAWN AEROBIC AND ANAEROBIC Blood Culture adequate volume   Culture   Final    NO GROWTH 4 DAYS Performed at [facility], [address], [place], [place] [address]    Report Status PENDING  Incomplete  Blood Culture (routine x 2)     Status: None (Preliminary result)   Collection Time: 01/26/23 12:20 PM   Specimen: BLOOD  Result Value Ref Range Status   Specimen Description BLOOD LEFT ANTECUBITAL  Final   Special Requests   Final    BOTTLES DRAWN AEROBIC AND ANAEROBIC Blood Culture adequate volume   Culture   Final    NO GROWTH 4 DAYS Performed at [facility], [address] [place], [place] [address]    Report Status PENDING  Incomplete  Respiratory (~20 pathogens) panel by PCR     Status: None   Collection Time: 01/26/23  4:22 PM   Specimen: Nasopharyngeal Swab; Respiratory  Result Value Ref Range Status   Adenovirus NOT DETECTED NOT DETECTED Final   Coronavirus 229E NOT DETECTED NOT DETECTED Final    Comment: (NOTE) The Coronavirus on the Respiratory Panel, DOES NOT test for the novel  Coronavirus (2019 nCoV)    Coronavirus HKU1 NOT DETECTED NOT DETECTED Final   Coronavirus NL63 NOT DETECTED NOT DETECTED Final   Coronavirus OC43 NOT DETECTED  NOT DETECTED Final   Metapneumovirus NOT DETECTED NOT DETECTED Final   Rhinovirus / Enterovirus NOT DETECTED NOT DETECTED Final   Influenza A NOT DETECTED NOT DETECTED Final   Influenza B NOT DETECTED NOT DETECTED Final   Parainfluenza Virus 1 NOT DETECTED NOT DETECTED Final   Parainfluenza Virus 2 NOT DETECTED NOT DETECTED Final   Parainfluenza Virus 3 NOT DETECTED NOT DETECTED Final   Parainfluenza Virus 4 NOT DETECTED NOT DETECTED Final   Respiratory Syncytial Virus NOT DETECTED NOT DETECTED Final   Bordetella pertussis NOT DETECTED NOT DETECTED Final   Bordetella Parapertussis NOT DETECTED NOT DETECTED Final  Chlamydophila pneumoniae NOT DETECTED NOT DETECTED Final   Mycoplasma pneumoniae NOT DETECTED NOT DETECTED Final    Comment: Performed at [facility], [address], [place], [place] [address]    Labs: CBC: Recent Labs  Lab 01/26/23 1141 01/27/23 0421 01/28/23 0240 01/29/23 0646 01/30/23 0356  WBC 12.1* 10.7* 11.0* 8.6 6.7  NEUTROABS  --  9.0*  --   --   --   HGB 12.5 10.6* 10.3* 10.1* 10.6*  HCT 38.6 32.2* 32.0* 30.9* 32.4*  MCV 91.9 90.2 93.0 92.0 92.6  PLT 173 158 176 201 215   Basic Metabolic Panel: Recent Labs  Lab 01/26/23 1141 01/27/23 0421 01/28/23 0240 01/29/23 0646 01/30/23 0356  NA 136 139 140 142 138  K 3.7 3.7 4.0 4.2 4.5  CL 100 108 109 109 102  CO2 27 24 24 26 30   GLUCOSE 150* 173* 147* 86 110*  BUN 31* 39* 40* 31* 30*  CREATININE 1.11* 0.99 0.89 0.87 0.82  CALCIUM 9.3 8.8* 8.9 8.9 8.9  MG  --   --  2.0 1.8 1.8   Liver Function Tests: Recent Labs  Lab 01/26/23 1141  AST 21  ALT 14  ALKPHOS 34*  BILITOT 0.9  PROT 7.7  ALBUMIN 3.8   CBG: No results for input(s): "GLUCAP" in the last 168 hours.  Discharge time spent: greater than 30 minutes.  Signed: Lurene Shadow, MD [facility] 01/30/2023

## 2023-01-30 NOTE — Plan of Care (Signed)
  Problem: Clinical Measurements: Goal: Respiratory complications will improve Outcome: Progressing   Problem: Activity: Goal: Risk for activity intolerance will decrease Outcome: Progressing   Problem: Coping: Goal: Level of anxiety will decrease Outcome: Progressing   

## 2023-01-30 NOTE — TOC Transition Note (Signed)
Transition of Care [facility]) - Discharge Note   Patient Details  Name: Shelley Thomas MRN: [id number] Date of Birth: September 23, 1940  [facility] CM/SW Contact:  Chapman Fitch, RN Phone Number: 01/30/2023, 10:55 AM   Clinical Narrative:    Patient to discharge today Patient with qualifying sats for home O2 Patient agreeable for home o2.  She declines the need for any home health or additional DME  Patient states that she does not have a preference of DME agency.   Referral made to [facility] with [employer] for portable O2 to be delivered to home.   Patient states that her spouse will transport at discharge      Barriers to Discharge: Continued Medical Work up   Patient Goals and CMS Choice Patient states their goals for this hospitalization and ongoing recovery are:: to return home, declines home health          Discharge Placement                       Discharge Plan and Services Additional resources added to the After Visit Summary for                                       Social Drivers of Health (SDOH) Interventions SDOH Screenings   Food Insecurity: No Food Insecurity (01/27/2023)  Housing: Unknown (01/27/2023)  Transportation Needs: Unknown (01/27/2023)  Utilities: Not At Risk (01/27/2023)  Tobacco Use: High Risk (01/26/2023)     Readmission Risk Interventions    01/28/2023   12:02 PM  Readmission Risk Prevention Plan  Post Dischage Appt Complete  Medication Screening Complete  Transportation Screening Complete

## 2023-01-31 LAB — CULTURE, BLOOD (ROUTINE X 2)
Culture: NO GROWTH
Culture: NO GROWTH
Special Requests: ADEQUATE
Special Requests: ADEQUATE

## 2023-02-09 ENCOUNTER — Ambulatory Visit
Admission: RE | Admit: 2023-02-09 | Discharge: 2023-02-09 | Disposition: A | Discharge status: Home / Self Care | Payer: Medicare Other | Attending: Internal Medicine | Admitting: Internal Medicine

## 2023-02-09 ENCOUNTER — Other Ambulatory Visit: Payer: Self-pay | Admitting: Internal Medicine

## 2023-02-09 ENCOUNTER — Ambulatory Visit
Admission: RE | Admit: 2023-02-09 | Discharge: 2023-02-09 | Disposition: A | Discharge status: Home / Self Care | Payer: Medicare Other | Source: Ambulatory Visit | Attending: Internal Medicine | Admitting: Internal Medicine

## 2023-02-09 DIAGNOSIS — J189 Pneumonia, unspecified organism: Secondary | ICD-10-CM

## 2023-03-19 ENCOUNTER — Ambulatory Visit
Admission: RE | Admit: 2023-03-19 | Discharge: 2023-03-19 | Disposition: A | Discharge status: Home / Self Care | Payer: Medicare Other | Source: Ambulatory Visit | Attending: Internal Medicine | Admitting: Internal Medicine

## 2023-03-19 ENCOUNTER — Other Ambulatory Visit: Payer: Self-pay | Admitting: Internal Medicine

## 2023-03-19 ENCOUNTER — Ambulatory Visit
Admission: RE | Admit: 2023-03-19 | Discharge: 2023-03-19 | Disposition: A | Discharge status: Home / Self Care | Payer: Medicare Other | Attending: Internal Medicine | Admitting: Internal Medicine

## 2023-03-19 DIAGNOSIS — R058 Other specified cough: Secondary | ICD-10-CM

## 2023-06-06 ENCOUNTER — Ambulatory Visit: Admitting: Dermatology

## 2023-06-06 ENCOUNTER — Encounter: Payer: Self-pay | Admitting: Dermatology

## 2023-06-06 DIAGNOSIS — C4352 Malignant melanoma of skin of breast: Secondary | ICD-10-CM | POA: Diagnosis not present

## 2023-06-06 DIAGNOSIS — C439 Malignant melanoma of skin, unspecified: Secondary | ICD-10-CM

## 2023-06-06 DIAGNOSIS — D485 Neoplasm of uncertain behavior of skin: Secondary | ICD-10-CM

## 2023-06-06 DIAGNOSIS — D492 Neoplasm of unspecified behavior of bone, soft tissue, and skin: Secondary | ICD-10-CM | POA: Diagnosis not present

## 2023-06-06 HISTORY — DX: Malignant melanoma of skin, unspecified: C43.9

## 2023-06-06 MED ORDER — MUPIROCIN 2 % EX OINT: 1.0000 | TOPICAL_OINTMENT | Freq: Every day | CUTANEOUS | 0 refills | Status: DC

## 2023-06-06 NOTE — Progress Notes (Signed)
 Follow-Up Visit   Subjective  Shelley Thomas is a 83 y.o. female who presents for the following: Excision of possible skin cancer at right breast.  The following portions of the chart were reviewed this encounter and updated as appropriate: medications, allergies, medical history  Review of Systems:  No other skin or systemic complaints except as noted in HPI or Assessment and Plan.  Objective  Well appearing patient in no apparent distress; mood and affect are within normal limits.  A focused examination was performed of the following areas: chest Relevant physical exam findings are noted in the Assessment and Plan.   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 Skin excision  Excision method:  elliptical Lesion length (cm):  2.5 Lesion width (cm):  1.5 Margin per side (cm):  1 Total excision diameter (cm):  4.5 Informed consent: discussed and consent obtained   Timeout: patient name, date of birth, surgical site, and procedure verified   Procedure prep:  Patient was prepped and draped in usual sterile fashion Prep type:  Chlorhexidine Anesthesia: the lesion was anesthetized in a standard fashion   Anesthetic:  1% lidocaine  w/ epinephrine  1-100,000 buffered w/ 8.4% NaHCO3 (18 cc with epi, 11 cc without epi) Instrument used: #15 blade   Hemostasis achieved with: pressure   Outcome: patient tolerated procedure well with no complications   Additional details:  Tagged superior Excision down to deep subcuticular fat  Skin repair Complexity:  Intermediate Final length (cm):  10.5 Informed consent: discussed and consent obtained   Timeout: patient name, date of birth, surgical site, and procedure verified   Procedure prep:  Patient was prepped and draped  in usual sterile fashion Prep type:  Chlorhexidine Anesthesia: the lesion was anesthetized in a standard fashion   Anesthetic:  1% lidocaine  w/ epinephrine  1-100,000 buffered w/ 8.4% NaHCO3 Reason for type of repair: reduce tension to allow closure, reduce the risk of dehiscence, infection, and necrosis, reduce subcutaneous dead space and avoid a hematoma, allow closure of the large defect, preserve normal anatomy, avoid adjacent structures and allow side-to-side closure without requiring a flap or graft   Undermining: edges could be approximated without difficulty   Subcutaneous layers (deep stitches):  Suture size:  3-0 Suture type: Monocryl (poliglecaprone 25)   Stitches:  Buried vertical mattress Fine/surface layer approximation (top stitches):  Suture size:  4-0 Suture type: Prolene (polypropylene)   Stitches comment:  Running locked Suture removal (days):  14 Hemostasis achieved with: suture, pressure and electrodesiccation Outcome: patient tolerated procedure well with no complications   Post-procedure details: sterile dressing applied and wound care instructions given   Dressing type: petrolatum, bandage and pressure dressing   Specimen 1 - Surgical pathology Differential Diagnosis: Melanoma  Check Margins: yes Tagged Superior  [facility] New patient. Sent by Dr Rosalin [place]. No official referral. Dr Lorraine Roses Dr Bary Likes who requested that I see patient for concerning lesion. Clinically suggestive of invasive melanoma, locally advanced and risk of metastatic disease. Explained that melanoma can be deadly if advanced and untreated. Lesion was most likely caused by UV damage to the skin based on severe actinic elastosis. Unlikely to be caused by scratching it two weeks ago as patient reasoned. Has likely been present for months or years.  Opted to perform wide local excision.  Excision was limited by lidocaine  efficacy. Patient has red hair and is likely genetically resistant. Limit of  lidocaine  was reached and deep margin was not fully anesthetized so could not use electrodesiccation for hemostasis. Instead used sutures and pressure from hemostats. Excision was extensive and lidocaine  effects were beginning to wane so the time available for the repair was limited. Thus, redundant skin at the edges of the repair could not be addressed.  Patient asked to talk about hair loss after wound was bandaged. Appointment had already gone over time by an hour. Stated that we should prioritize skin cancer treatment and that other concerns can be addressed at future appointments   Return for TBSE, Suture Removal, as scheduled.  Kerstin Peeling, RMA, am acting as scribe for Harris Liming, MD .   Documentation: I have reviewed the above documentation for accuracy and completeness, and I agree with the above.  Harris Liming, MD

## 2023-06-06 NOTE — Patient Instructions (Addendum)
 Wound Care Instructions for After Surgery  Two days after your surgery (Friday May 2), you should begin doing daily dressing changes until your sutures are removed: Remove the bandage. Cleanse the wound gently with soap and water.  Make sure you then dry the skin surrounding the wound completely or the tape will not stick to the skin. Do not use cotton balls on the wound. After the wound is clean and dry, apply the ointment (either prescription antibiotic prescribed by your doctor or plain Vaseline if nothing was prescribed) gently with a Q-tip. If you are using a bandaid to cover: Apply a bandaid large enough to cover the entire wound. If you do not have a bandaid large enough to cover the wound OR if you are sensitive to bandaid adhesive: Cut a non-stick pad (such as Telfa) to fit the size of the wound.  Cover the wound with the non-stick pad. If the wound is draining, you may want to add a small amount of gauze on top of the non-stick pad for a little added compression to the area. Use tape to seal the area completely.  For the next 1-2 weeks: Be sure to keep the wound moist with ointment 24/7 to ensure best healing. If you are unable to cover the wound with a bandage to hold the ointment in place, you may need to reapply the ointment several times a day. Do not bend over or lift heavy items to reduce the chance of elevated blood pressure to the wound. Do not participate in particularly strenuous activities.  Below is a list of dressing supplies you might need.  Cotton-tipped applicators - Q-tips Gauze pads (2x2 and/or 4x4) - All-Purpose Sponges New and clean tube of petroleum jelly (Vaseline) OR prescription antibiotic ointment if prescribed Either a bandaid large enough to cover the entire wound OR non-stick dressing material (Telfa) and Tape (Paper or Hypafix)  FOR ADULT SURGERY PATIENTS: If you need something for pain relief, you may take 1 extra strength Tylenol  (acetaminophen ) and  2 ibuprofen (200 mg) together every 4 hours as needed. (Do not take these medications if you are allergic to them or if you know you cannot take them for any other reason). Typically you may only need pain medication for 1-3 days.   Comments on the Post-Operative Period Slight swelling and redness often appear around the wound. This is normal and will disappear within several days following the surgery. The healing wound will drain a brownish-red-yellow discharge during healing. This is a normal phase of wound healing. As the wound begins to heal, the drainage may increase in amount. Again, this drainage is normal. Notify us  if the drainage becomes persistently bloody, excessively swollen, or intensely painful or develops a foul odor or red streaks.  The healing wound will also typically be itchy. This is normal. If you have severe or persistent pain, Notify us  if the discomfort is severe or persistent. Avoid alcoholic beverages when taking pain medicine.  In Case of Wound Hemorrhage A wound hemorrhage is when the bandage suddenly becomes soaked with bright red blood and flows profusely. If this happens, sit down or lie down with your head elevated. If the wound has a dressing on it, do not remove the dressing. Apply pressure to the existing gauze. If the wound is not covered, use a gauze pad to apply pressure and continue applying the pressure for 20 minutes without peeking. DO NOT COVER THE WOUND WITH A LARGE TOWEL OR WASH CLOTH. Release your hand  from the wound site but do not remove the dressing. If the bleeding has stopped, gently clean around the wound. Leave the dressing in place for 24 hours if possible. This wait time allows the blood vessels to close off so that you do not spark a new round of bleeding by disrupting the newly clotted blood vessels with an immediate dressing change. If the bleeding does not subside, continue to hold pressure for 40 minutes. If bleeding continues, page your  physician, contact an After Hours clinic or go to the Emergency Room.  Due to recent changes in healthcare laws, you may see results of your pathology and/or laboratory studies on MyChart before the doctors have had a chance to review them. We understand that in some cases there may be results that are confusing or concerning to you. Please understand that not all results are received at the same time and often the doctors may need to interpret multiple results in order to provide you with the best plan of care or course of treatment. Therefore, we ask that you please give us  2 business days to thoroughly review all your results before contacting the office for clarification. Should we see a critical lab result, you will be contacted sooner.   If You Need Anything After Your Visit  If you have any questions or concerns for your doctor, please call our main line at [phone] and press option 4 to reach your doctor's medical assistant. If no one answers, please leave a voicemail as directed and we will return your call as soon as possible. Messages left after 4 pm will be answered the following business day.   You may also send us  a message via MyChart. We typically respond to MyChart messages within 1-2 business days.  For prescription refills, please ask your pharmacy to contact our office. Our fax number is [phone].  If you have an urgent issue when the clinic is closed that cannot wait until the next business day, you can page your doctor at the number below.    Please note that while we do our best to be available for urgent issues outside of office hours, we are not available 24/7.   If you have an urgent issue and are unable to reach us , you may choose to seek medical care at your doctor's office, retail clinic, urgent care center, or emergency room.  If you have a medical emergency, please immediately call 911 or go to the emergency department.  Pager Numbers  - Dr. Bary Likes:  [phone]  - Dr. Annette Barters: [phone]  - Dr. Felipe Horton: [phone]   In the event of inclement weather, please call our main line at [phone] for an update on the status of any delays or closures.  Dermatology Medication Tips: Please keep the boxes that topical medications come in in order to help keep track of the instructions about where and how to use these. Pharmacies typically print the medication instructions only on the boxes and not directly on the medication tubes.   If your medication is too expensive, please contact our office at [phone] option 4 or send us  a message through MyChart.   We are unable to tell what your co-pay for medications will be in advance as this is different depending on your insurance coverage. However, we may be able to find a substitute medication at lower cost or fill out paperwork to get insurance to cover a needed medication.   If a prior authorization is required to get your  medication covered by your insurance company, please allow us  1-2 business days to complete this process.  Drug prices often vary depending on where the prescription is filled and some pharmacies may offer cheaper prices.  The website www.goodrx.com contains coupons for medications through different pharmacies. The prices here do not account for what the cost may be with help from insurance (it may be cheaper with your insurance), but the website can give you the price if you did not use any insurance.  - You can print the associated coupon and take it with your prescription to the pharmacy.  - You may also stop by our office during regular business hours and pick up a GoodRx coupon card.  - If you need your prescription sent electronically to a different pharmacy, notify our office through [facility] or by phone at [phone] option 4.     Si Usted Necesita Algo Despus de Su Visita  Tambin puede enviarnos un mensaje a travs de Clinical cytogeneticist. Por lo general  respondemos a los mensajes de MyChart en el transcurso de 1 a 2 das hbiles.  Para renovar recetas, por favor pida a su farmacia que se ponga en contacto con nuestra oficina. Franz Jacks de fax es [place] [phone].  Si tiene un asunto urgente cuando la clnica est cerrada y que no puede esperar hasta el siguiente da hbil, puede llamar/localizar a su doctor(a) al nmero que aparece a continuacin.   Por favor, tenga en cuenta que aunque hacemos todo lo posible para estar disponibles para asuntos urgentes fuera del horario de [place], no estamos disponibles las 24 horas del da, los 7 [address]  [address] de la [place].   Si tiene un problema urgente y no puede comunicarse con nosotros, puede optar por buscar atencin mdica  en el consultorio de su doctor(a), en una clnica privada, en un centro de atencin urgente o en una sala de emergencias.  Si tiene Engineer, drilling, por favor llame inmediatamente al 911 o vaya a la sala de emergencias.  Nmeros de bper  - Dr. Bary Likes: [phone]  - Dra. Annette Barters: [phone]  - Dr. Felipe Horton: [phone]   En caso de inclemencias del tiempo, por favor llame a Lajuan Pila principal al [phone] para una actualizacin sobre el [place] de cualquier retraso o cierre.  Consejos para la medicacin en dermatologa: Por favor, guarde las cajas en las que vienen los medicamentos de uso tpico para ayudarle a seguir las instrucciones sobre dnde y cmo usarlos. Las farmacias generalmente imprimen las instrucciones del medicamento slo en las cajas y no directamente en los tubos del [place].   Si su medicamento es muy caro, por favor, pngase en contacto con Bettyjane Brunet llamando al [phone] y presione la opcin 4 o envenos un mensaje a travs de Clinical cytogeneticist.   No podemos decirle cul ser su copago por los medicamentos por adelantado ya que esto es diferente dependiendo de la cobertura de su seguro. Sin embargo, es posible que podamos encontrar un  medicamento sustituto a Audiological scientist un formulario para que el seguro cubra el medicamento que se considera necesario.   Si se requiere una autorizacin previa para que su compaa de seguros [place] su medicamento, por favor permtanos de 1 a 2 das hbiles para completar este proceso.  Los precios de los medicamentos varan con frecuencia dependiendo del Environmental consultant de dnde se surte la receta y alguna farmacias pueden ofrecer precios ms baratos.  El sitio web www.goodrx.com tiene cupones para medicamentos de Health and safety inspector. Los  precios aqu no tienen en cuenta lo que podra costar con la ayuda del seguro (puede ser ms barato con su seguro), pero el sitio web puede darle el precio si no utiliz Tourist information centre manager.  - Puede imprimir el cupn correspondiente y llevarlo con su receta a la farmacia.  - Tambin puede pasar por nuestra oficina durante el horario de atencin regular y Education officer, museum una tarjeta de cupones de GoodRx.  - Si necesita que su receta se enve electrnicamente a una farmacia diferente, informe a nuestra oficina a travs de MyChart de [place] o por telfono llamando al [phone] y presione la opcin 4.

## 2023-06-08 ENCOUNTER — Telehealth: Payer: Self-pay | Admitting: Dermatology

## 2023-06-08 ENCOUNTER — Other Ambulatory Visit: Payer: Self-pay | Admitting: Dermatology

## 2023-06-08 LAB — SURGICAL PATHOLOGY

## 2023-06-08 NOTE — Telephone Encounter (Signed)
 Spoke to patient by phone. Shared result of invasive melanoma. Margins are clear but this alone does not rule out metastasis. We are referring to [facility] surgical oncology for further workup to rule out or treat metastatic disease. Results of workup at [facility] will be communicated by [facility] providers  Discussed wound care and that patient can refer to [facility] for instructions. Remove bandage today, cleanse, cover incision with mupirocin , and apply fresh bandage daily until follow up. Patient reports wound is doing well

## 2023-06-09 ENCOUNTER — Other Ambulatory Visit: Payer: Self-pay | Admitting: Dermatology

## 2023-06-11 ENCOUNTER — Other Ambulatory Visit: Payer: Self-pay

## 2023-06-11 ENCOUNTER — Telehealth: Payer: Self-pay

## 2023-06-11 DIAGNOSIS — C4359 Malignant melanoma of other part of trunk: Secondary | ICD-10-CM

## 2023-06-11 NOTE — Telephone Encounter (Signed)
 Patient called to asking if she could apply 2 gauze as a bandage, discussed with patient yes she can apply 2 gauze.  Patient wanted to know also if she could sweep her floor discussed with patient she can sweep but if she notice any pain or discomfort she should stop sweeping

## 2023-06-19 ENCOUNTER — Telehealth: Payer: Self-pay

## 2023-06-19 NOTE — Telephone Encounter (Signed)
 Patient has called office multiple times this morning.  First phone call was to confirm location of the office for her suture removal. Patient called back asking if OK to return to the tanning bed after suture removal. Advised patient that as a dermatology practice we advise against tanning beds. Patient states she only goes once weekly for arthritis. Advised patient again since she just had a melanoma removed, we highly suggest against the tanning bed. aw

## 2023-06-21 ENCOUNTER — Encounter: Payer: Self-pay | Admitting: Dermatology

## 2023-06-21 ENCOUNTER — Ambulatory Visit (INDEPENDENT_AMBULATORY_CARE_PROVIDER_SITE_OTHER): Admitting: Dermatology

## 2023-06-21 DIAGNOSIS — L821 Other seborrheic keratosis: Secondary | ICD-10-CM | POA: Diagnosis not present

## 2023-06-21 DIAGNOSIS — D045 Carcinoma in situ of skin of trunk: Secondary | ICD-10-CM | POA: Diagnosis not present

## 2023-06-21 DIAGNOSIS — Z1283 Encounter for screening for malignant neoplasm of skin: Secondary | ICD-10-CM

## 2023-06-21 DIAGNOSIS — L578 Other skin changes due to chronic exposure to nonionizing radiation: Secondary | ICD-10-CM | POA: Diagnosis not present

## 2023-06-21 DIAGNOSIS — C44619 Basal cell carcinoma of skin of left upper limb, including shoulder: Secondary | ICD-10-CM | POA: Diagnosis not present

## 2023-06-21 DIAGNOSIS — D099 Carcinoma in situ, unspecified: Secondary | ICD-10-CM

## 2023-06-21 DIAGNOSIS — Z5189 Encounter for other specified aftercare: Secondary | ICD-10-CM

## 2023-06-21 DIAGNOSIS — L570 Actinic keratosis: Secondary | ICD-10-CM

## 2023-06-21 DIAGNOSIS — C439 Malignant melanoma of skin, unspecified: Secondary | ICD-10-CM

## 2023-06-21 DIAGNOSIS — D485 Neoplasm of uncertain behavior of skin: Secondary | ICD-10-CM

## 2023-06-21 DIAGNOSIS — C4352 Malignant melanoma of skin of breast: Secondary | ICD-10-CM

## 2023-06-21 DIAGNOSIS — D229 Melanocytic nevi, unspecified: Secondary | ICD-10-CM

## 2023-06-21 DIAGNOSIS — W908XXA Exposure to other nonionizing radiation, initial encounter: Secondary | ICD-10-CM

## 2023-06-21 DIAGNOSIS — D492 Neoplasm of unspecified behavior of bone, soft tissue, and skin: Secondary | ICD-10-CM | POA: Diagnosis not present

## 2023-06-21 DIAGNOSIS — C4491 Basal cell carcinoma of skin, unspecified: Secondary | ICD-10-CM

## 2023-06-21 DIAGNOSIS — Z4802 Encounter for removal of sutures: Secondary | ICD-10-CM

## 2023-06-21 DIAGNOSIS — L814 Other melanin hyperpigmentation: Secondary | ICD-10-CM

## 2023-06-21 DIAGNOSIS — W891XXA Exposure to tanning bed, initial encounter: Secondary | ICD-10-CM

## 2023-06-21 DIAGNOSIS — D1801 Hemangioma of skin and subcutaneous tissue: Secondary | ICD-10-CM

## 2023-06-21 HISTORY — DX: Carcinoma in situ, unspecified: D09.9

## 2023-06-21 HISTORY — DX: Basal cell carcinoma of skin, unspecified: C44.91

## 2023-06-21 NOTE — Patient Instructions (Addendum)
 Wound Care Instructions  Cleanse wound gently with soap and water once a day then pat dry with clean gauze. Apply a thin coat of Petrolatum (petroleum jelly, "Vaseline") over the wound (unless you have an allergy to this). We recommend that you use a new, sterile tube of Vaseline. Do not pick or remove scabs. Do not remove the yellow or white "healing tissue" from the base of the wound.  Cover the wound with fresh, clean, nonstick gauze and secure with paper tape. You may use Band-Aids in place of gauze and tape if the wound is small enough, but would recommend trimming much of the tape off as there is often too much. Sometimes Band-Aids can irritate the skin.  You should call the office for your biopsy report after 1 week if you have not already been contacted.  If you experience any problems, such as abnormal amounts of bleeding, swelling, significant bruising, significant pain, or evidence of infection, please call the office immediately.  FOR ADULT SURGERY PATIENTS: If you need something for pain relief you may take 1 extra strength Tylenol (acetaminophen) AND 2 Ibuprofen (200mg  each) together every 4 hours as needed for pain. (do not take these if you are allergic to them or if you have a reason you should not take them.) Typically, you may only need pain medication for 1 to 3 days.   Melanoma ABCDEs  Melanoma is the most dangerous type of skin cancer, and is the leading cause of death from skin disease.  You are more likely to develop melanoma if you: Have light-colored skin, light-colored eyes, or red or blond hair Spend a lot of time in the sun Tan regularly, either outdoors or in a tanning bed Have had blistering sunburns, especially during childhood Have a close family member who has had a melanoma Have atypical moles or large birthmarks  Early detection of melanoma is key since treatment is typically straightforward and cure rates are extremely high if we catch it early.   The  first sign of melanoma is often a change in a mole or a new dark spot.  The ABCDE system is a way of remembering the signs of melanoma.  A for asymmetry:  The two halves do not match. B for border:  The edges of the growth are irregular. C for color:  A mixture of colors are present instead of an even brown color. D for diameter:  Melanomas are usually (but not always) greater than 6mm - the size of a pencil eraser. E for evolution:  The spot keeps changing in size, shape, and color.  Please check your skin once per month between visits. You can use a small mirror in front and a large mirror behind you to keep an eye on the back side or your body.   If you see any new or changing lesions before your next follow-up, please call to schedule a visit.  Please continue daily skin protection including broad spectrum sunscreen SPF 30+ to sun-exposed areas, reapplying every 2 hours as needed when you're outdoors.    Due to recent changes in healthcare laws, you may see results of your pathology and/or laboratory studies on MyChart before the doctors have had a chance to review them. We understand that in some cases there may be results that are confusing or concerning to you. Please understand that not all results are received at the same time and often the doctors may need to interpret multiple results in order to provide you  with the best plan of care or course of treatment. Therefore, we ask that you please give [place] 2 business days to thoroughly review all your results before contacting the office for clarification. Should we see a critical lab result, you will be contacted sooner.   If You Need Anything After Your Visit  If you have any questions or concerns for your doctor, please call our main line at [phone] and press option 4 to reach your doctor's medical assistant. If no one answers, please leave a voicemail as directed and we will return your call as soon as possible. Messages left after 4  pm will be answered the following business day.   You may also send [place] a message via MyChart. We typically respond to MyChart messages within 1-2 business days.  For prescription refills, please ask your pharmacy to contact our office. Our fax number is [phone].  If you have an urgent issue when the clinic is closed that cannot wait until the next business day, you can page your doctor at the number below.    Please note that while we do our best to be available for urgent issues outside of office hours, we are not available 24/7.   If you have an urgent issue and are unable to reach [place], you may choose to seek medical care at your doctor's office, retail clinic, urgent care center, or emergency room.  If you have a medical emergency, please immediately call 911 or go to the emergency department.  Pager Numbers  - Dr. Gwen Pounds: [phone]  - Dr. Roseanne Reno: [phone]  - Dr. Katrinka Blazing: [phone]   In the event of inclement weather, please call our main line at [phone] for an update on the status of any delays or closures.  Dermatology Medication Tips: Please keep the boxes that topical medications come in in order to help keep track of the instructions about where and how to use these. Pharmacies typically print the medication instructions only on the boxes and not directly on the medication tubes.   If your medication is too expensive, please contact our office at [phone] option 4 or send [place] a message through MyChart.   We are unable to tell what your co-pay for medications will be in advance as this is different depending on your insurance coverage. However, we may be able to find a substitute medication at lower cost or fill out paperwork to get insurance to cover a needed medication.   If a prior authorization is required to get your medication covered by your insurance company, please allow [place] 1-2 business days to complete this process.  Drug prices often vary  depending on where the prescription is filled and some pharmacies may offer cheaper prices.  The website www.goodrx.com contains coupons for medications through different pharmacies. The prices here do not account for what the cost may be with help from insurance (it may be cheaper with your insurance), but the website can give you the price if you did not use any insurance.  - You can print the associated coupon and take it with your prescription to the pharmacy.  - You may also stop by our office during regular business hours and pick up a GoodRx coupon card.  - If you need your prescription sent electronically to a different pharmacy, notify our office through [facility] or by phone at [phone] option 4.     Si Usted Necesita Algo Despus de Su Visita  Tambin puede enviarnos un mensaje a  travs de MyChart. Por lo general respondemos a los mensajes de MyChart en el transcurso de 1 a 2 das hbiles.  Para renovar recetas, por favor pida a su farmacia que se ponga en contacto con nuestra oficina. Annie Sable de fax es [place] [phone].  Si tiene un asunto urgente cuando la clnica est cerrada y que no puede esperar hasta el siguiente da hbil, puede llamar/localizar a su doctor(a) al nmero que aparece a continuacin.   Por favor, tenga en cuenta que aunque hacemos todo lo posible para estar disponibles para asuntos urgentes fuera del horario de [place], no estamos disponibles las 24 horas del da, los 7 [address]  [address] de la [place].   Si tiene un problema urgente y no puede comunicarse con nosotros, puede optar por buscar atencin mdica  en el consultorio de su doctor(a), en una clnica privada, en un centro de atencin urgente o en una sala de emergencias.  Si tiene Engineer, drilling, por favor llame inmediatamente al 911 o vaya a la sala de emergencias.  Nmeros de bper  - Dr. Gwen Pounds: [phone]  - Dra. Roseanne Reno: [phone]  - Dr. Katrinka Blazing: [phone]   En caso de  inclemencias del tiempo, por favor llame a Lacy Duverney principal al [phone] para una actualizacin sobre el [place] de cualquier retraso o cierre.  Consejos para la medicacin en dermatologa: Por favor, guarde las cajas en las que vienen los medicamentos de uso tpico para ayudarle a seguir las instrucciones sobre dnde y cmo usarlos. Las farmacias generalmente imprimen las instrucciones del medicamento slo en las cajas y no directamente en los tubos del [place].   Si su medicamento es muy caro, por favor, pngase en contacto con Rolm Gala llamando al [phone] y presione la opcin 4 o envenos un mensaje a travs de Clinical cytogeneticist.   No podemos decirle cul ser su copago por los medicamentos por adelantado ya que esto es diferente dependiendo de la cobertura de su seguro. Sin embargo, es posible que podamos encontrar un medicamento sustituto a Audiological scientist un formulario para que el seguro cubra el medicamento que se considera necesario.   Si se requiere una autorizacin previa para que su compaa de seguros [place] su medicamento, por favor permtanos de 1 a 2 das hbiles para completar [address]  Los precios de los medicamentos varan con frecuencia dependiendo del Environmental consultant de dnde se surte la receta y alguna farmacias pueden ofrecer precios ms baratos.  El sitio web www.goodrx.com tiene cupones para medicamentos de Health and safety inspector. Los precios aqu no tienen en cuenta lo que podra costar con la ayuda del seguro (puede ser ms barato con su seguro), pero el sitio web puede darle el precio si no utiliz Tourist information centre manager.  - Puede imprimir el cupn correspondiente y llevarlo con su receta a la farmacia.  - Tambin puede pasar por nuestra oficina durante el horario de atencin regular y Education officer, museum una tarjeta de cupones de GoodRx.  - Si necesita que su receta se enve electrnicamente a una farmacia diferente, informe a nuestra oficina a travs de MyChart de [place] o  por telfono llamando al [phone] y presione la opcin 4.

## 2023-06-21 NOTE — Progress Notes (Signed)
 Follow-Up Visit   Subjective  Shelley Thomas is a 83 y.o. female who presents for the following: Skin Cancer Screening and Full Body Skin Exam  The patient presents for Total-Body Skin Exam (UBSE) for skin cancer screening and mole check. The patient has spots, moles and lesions to be evaluated, some may be new or changing and the patient may have concern these could be cancer.  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 Dorna Gasman 06/27/23.  The following portions of the chart were reviewed this encounter and updated as appropriate: medications, allergies, medical history  Review of Systems:  No other skin or systemic complaints except as noted in HPI or Assessment and Plan.  Objective  Well appearing patient in no apparent distress; mood and affect are within normal limits.  A full examination was performed including scalp, head, eyes, ears, nose, lips, neck, chest, axillae, abdomen, back, bilateral upper extremities, hands, fingers, toes, fingernails,. All findings within normal limits unless otherwise noted below.  Patient declined exam of legs feet buttocks Exam of face limited by presence of make up.   Relevant physical exam findings are noted in the Assessment and Plan.  Right Breast Healing excision site  left lateral shoulder 7 mm telangiectatic irregular patch  left chest 7 mm pigmented macule with adjacent hypopigmented macule  left neck 7 mm irregular brown macule with focal pink areas   Assessment & Plan   SKIN CANCER SCREENING PERFORMED TODAY.  ACTINIC DAMAGE - Chronic condition, secondary to cumulative UV/sun exposure - diffuse scaly erythematous macules with underlying dyspigmentation - Recommend daily broad spectrum sunscreen SPF 30+ to sun-exposed areas, reapply every 2 hours as needed.  - Staying in the shade or wearing long sleeves, sun glasses (UVA+UVB  protection) and wide brim hats (4-inch brim around the entire circumference of the hat) are also recommended for sun protection.  - Call for new or changing lesions. - no makeup for next check  LENTIGINES, SEBORRHEIC KERATOSES, HEMANGIOMAS - Benign normal skin lesions - Benign-appearing - Call for any changes  MELANOCYTIC NEVI - Tan-brown and/or pink-flesh-colored symmetric macules and papules - Benign appearing on exam today - Observation - Call clinic for new or changing moles - Recommend daily use of broad spectrum spf 30+ sunscreen to sun-exposed areas.    Encounter for Removal of Sutures - Incision site at the right breast is clean, dry and intact - Wound cleansed, sutures removed, wound cleansed and steri strips applied.  - Discussed pathology results showing MM, Clark's Level IV  - Patient advised to keep steri-strips dry until they fall off. - Scars remodel for a full year. - Once steri-strips fall off, patient can apply over-the-counter silicone scar cream each night to help with scar remodeling if desired. - Patient advised to call with any concerns or if they notice any new or changing lesions.  - Patient advised to avoid tanning beds. She said it helps with her arthritis, offered to send referral to rheumatology but patient defers.  - Samples of Eucerin and CeraVe sunscreens and lotions given to patient.     MELANOMA OF SKIN (HCC) Right Breast Continue treatment plan with [facility] surg onc NEOPLASM OF UNCERTAIN BEHAVIOR OF SKIN (3) left lateral shoulder Skin / nail biopsy Type of biopsy: tangential   Informed consent: discussed and consent obtained   Timeout: patient name, date of birth, surgical site, and procedure verified  Procedure prep:  Patient was prepped and draped in usual sterile fashion Prep type:  Isopropyl alcohol Anesthesia: the lesion was anesthetized in a standard fashion   Anesthetic:  1% lidocaine  w/ epinephrine  1-100,000 buffered w/ 8.4%  NaHCO3 Instrument used: DermaBlade   Hemostasis achieved with: pressure and aluminum chloride   Outcome: patient tolerated procedure well   Post-procedure details: sterile dressing applied and wound care instructions given   Dressing type: bandage and petrolatum   Specimen 1 - Surgical pathology Differential Diagnosis: r/o BCC  Check Margins: No 7 mm telangiectatic irregular patch left chest Skin / nail biopsy Type of biopsy: tangential   Informed consent: discussed and consent obtained   Timeout: patient name, date of birth, surgical site, and procedure verified   Procedure prep:  Patient was prepped and draped in usual sterile fashion Prep type:  Isopropyl alcohol Anesthesia: the lesion was anesthetized in a standard fashion   Anesthetic:  1% lidocaine  w/ epinephrine  1-100,000 buffered w/ 8.4% NaHCO3 Instrument used: DermaBlade   Hemostasis achieved with: pressure and aluminum chloride   Outcome: patient tolerated procedure well   Post-procedure details: sterile dressing applied and wound care instructions given   Dressing type: bandage and petrolatum   Specimen 2 - Surgical pathology Differential Diagnosis: Dysplastic Nevus r/o MM  Check Margins: No 7 mm pigmented macule with adjacent hypopigmented macule left neck Skin / nail biopsy Type of biopsy: tangential   Informed consent: discussed and consent obtained   Timeout: patient name, date of birth, surgical site, and procedure verified   Procedure prep:  Patient was prepped and draped in usual sterile fashion Prep type:  Isopropyl alcohol Anesthesia: the lesion was anesthetized in a standard fashion   Anesthetic:  1% lidocaine  w/ epinephrine  1-100,000 buffered w/ 8.4% NaHCO3 Instrument used: DermaBlade   Hemostasis achieved with: pressure and aluminum chloride   Outcome: patient tolerated procedure well   Post-procedure details: sterile dressing applied and wound care instructions given   Dressing type: bandage and  petrolatum   Specimen 3 - Surgical pathology Differential Diagnosis: Dysplastic Nevus r/o MM  Check Margins: No 7 mm irregular brown macule with focal pink areas MULTIPLE BENIGN NEVI   LENTIGINES   ACTINIC ELASTOSIS   SEBORRHEIC KERATOSES   CHERRY ANGIOMA   VISIT FOR WOUND CHECK   ENCOUNTER FOR REMOVAL OF SUTURES   Return in about 3 months (around 09/21/2023) for TBSE, with Dr. Felipe Horton, HxMM.  Kerstin Peeling, RMA, am acting as scribe for Harris Liming, MD .   Documentation: I have reviewed the above documentation for accuracy and completeness, and I agree with the above.  Harris Liming, MD

## 2023-06-26 LAB — SURGICAL PATHOLOGY

## 2023-06-27 ENCOUNTER — Ambulatory Visit: Payer: Self-pay | Admitting: Dermatology

## 2023-06-28 ENCOUNTER — Encounter: Payer: Self-pay | Admitting: Dermatology

## 2023-06-28 NOTE — Telephone Encounter (Signed)
-----   Message from [facility] sent at 06/27/2023  1:18 PM EDT ----- Diagnosis 1. Skin, left lateral shoulder :       SUPERFICIAL BASAL CELL CARCINOMA        2. Skin, left chest :       PIGMENTED SQUAMOUS CELL CARCINOMA IN SITU, INFLAMED        3. Skin, left neck :       ACTINIC KERATOSIS AND SEBORRHEIC KERATOSIS, PIGMENTED     Please call with diagnosis and message me with patient's decision on treatment.  1. L shoulder Biopsy shows basal cell skin cancer in top layer of skin Treatment: Treatment option 1: you return for a brief appointment where I perform electrodesiccation and curettage (ED&C). This involves three rounds of scraping and burning to destroy the skin cancer. It has about an 85% cure rate and leaves a round wound slightly larger than the skin cancer and leaves a round white scar. No additional pathology is done. If the skin cancer comes back, we would need to do a surgery to remove it.   Treatment option 2: imiquimod is a cream that helps your immune system clear the skin cancer. You will apply the cream on weekdays for 6 weeks. It has an 88% cure rate. If redness and irritation develop, take 3 days off before restarting. If an open sore develops, stop the cream and send us  a message on MyChart or call us .  If the cream does not clear the cancer, surgery will be required. ##please make 2 month follow up for recheck  2. L chest Explanation: Biopsy shows a squamous cell skin cancer limited to the top layer of skin. This means it is an early cancer and has not spread. However, it has the potential to spread beyond the skin and threaten your health, so we recommend treating it.   Treatment option 1: a cream (fluorouracil and calcipotriene) that helps your immune system clear the skin cancer. It will cause redness and irritation. Wait two weeks after the biopsy to start applying the cream. Apply the cream twice per day until the redness and irritation develop (usually occurs  by day 7), then stop and allow it to heal. We will recheck the area in 2 months to ensure the cancer is gone. The cream is $45 plus shipping and will be mailed to you from a low cost compounding pharmacy.  Treatment option 2: you return for a brief appointment where I perform electrodesiccation and curettage [facility]). This involves three rounds of scraping and burning to destroy the skin cancer. It has about an 85% cure rate and leaves a round wound slightly larger than the skin cancer and leaves a round white scar. No additional pathology is done. If the skin cancer comes back, we would need to do a surgery to remove it.  3. L neck Biopsy shows a precancer and benign keratosis. The precancer will likely resolve after the biopsy so no treatment is needed.

## 2023-06-28 NOTE — Telephone Encounter (Signed)
 Patient advised bx results. She wants to think about treatment options. Will call pt back next week. Sue Em., RMA

## 2023-07-04 NOTE — Telephone Encounter (Signed)
-----   Message from [facility] sent at 06/27/2023  1:18 PM EDT ----- Diagnosis 1. Skin, left lateral shoulder :       SUPERFICIAL BASAL CELL CARCINOMA        2. Skin, left chest :       PIGMENTED SQUAMOUS CELL CARCINOMA IN SITU, INFLAMED        3. Skin, left neck :       ACTINIC KERATOSIS AND SEBORRHEIC KERATOSIS, PIGMENTED     Please call with diagnosis and message me with patient's decision on treatment.  1. L shoulder Biopsy shows basal cell skin cancer in top layer of skin Treatment: Treatment option 1: you return for a brief appointment where I perform electrodesiccation and curettage (ED&C). This involves three rounds of scraping and burning to destroy the skin cancer. It has about an 85% cure rate and leaves a round wound slightly larger than the skin cancer and leaves a round white scar. No additional pathology is done. If the skin cancer comes back, we would need to do a surgery to remove it.   Treatment option 2: imiquimod is a cream that helps your immune system clear the skin cancer. You will apply the cream on weekdays for 6 weeks. It has an 88% cure rate. If redness and irritation develop, take 3 days off before restarting. If an open sore develops, stop the cream and send us  a message on MyChart or call us .  If the cream does not clear the cancer, surgery will be required. ##please make 2 month follow up for recheck  2. L chest Explanation: Biopsy shows a squamous cell skin cancer limited to the top layer of skin. This means it is an early cancer and has not spread. However, it has the potential to spread beyond the skin and threaten your health, so we recommend treating it.   Treatment option 1: a cream (fluorouracil and calcipotriene) that helps your immune system clear the skin cancer. It will cause redness and irritation. Wait two weeks after the biopsy to start applying the cream. Apply the cream twice per day until the redness and irritation develop (usually occurs  by day 7), then stop and allow it to heal. We will recheck the area in 2 months to ensure the cancer is gone. The cream is $45 plus shipping and will be mailed to you from a low cost compounding pharmacy.  Treatment option 2: you return for a brief appointment where I perform electrodesiccation and curettage [facility]). This involves three rounds of scraping and burning to destroy the skin cancer. It has about an 85% cure rate and leaves a round wound slightly larger than the skin cancer and leaves a round white scar. No additional pathology is done. If the skin cancer comes back, we would need to do a surgery to remove it.  3. L neck Biopsy shows a precancer and benign keratosis. The precancer will likely resolve after the biopsy so no treatment is needed.

## 2023-07-04 NOTE — Telephone Encounter (Signed)
 Patient opts to treat with EDC. Scheduled 07/19/23 at 10:30 am. Sue Em., RMA

## 2023-07-16 ENCOUNTER — Ambulatory Visit
Admission: RE | Admit: 2023-07-16 | Discharge: 2023-07-16 | Disposition: A | Discharge status: Home / Self Care | Source: Ambulatory Visit | Attending: Internal Medicine | Admitting: Internal Medicine

## 2023-07-16 ENCOUNTER — Other Ambulatory Visit: Payer: Self-pay | Admitting: Internal Medicine

## 2023-07-16 ENCOUNTER — Ambulatory Visit
Admission: RE | Admit: 2023-07-16 | Discharge: 2023-07-16 | Disposition: A | Discharge status: Home / Self Care | Attending: Internal Medicine | Admitting: Internal Medicine

## 2023-07-16 DIAGNOSIS — R059 Cough, unspecified: Secondary | ICD-10-CM

## 2023-07-19 ENCOUNTER — Ambulatory Visit: Admitting: Dermatology

## 2023-07-24 ENCOUNTER — Encounter: Payer: Self-pay | Admitting: Dermatology

## 2023-07-24 ENCOUNTER — Ambulatory Visit: Admitting: Dermatology

## 2023-07-24 DIAGNOSIS — C44619 Basal cell carcinoma of skin of left upper limb, including shoulder: Secondary | ICD-10-CM | POA: Diagnosis not present

## 2023-07-24 DIAGNOSIS — D045 Carcinoma in situ of skin of trunk: Secondary | ICD-10-CM

## 2023-07-24 DIAGNOSIS — D099 Carcinoma in situ, unspecified: Secondary | ICD-10-CM

## 2023-07-24 DIAGNOSIS — C4491 Basal cell carcinoma of skin, unspecified: Secondary | ICD-10-CM

## 2023-07-24 NOTE — Patient Instructions (Signed)

## 2023-07-24 NOTE — Progress Notes (Signed)
   Follow-Up Visit   Subjective  Shelley Thomas is a 83 y.o. female who presents for the following: EDC x 2  The patient has spots, moles and lesions to be evaluated, some may be new or changing and the patient may have concern these could be cancer.   The following portions of the chart were reviewed this encounter and updated as appropriate: medications, allergies, medical history  Review of Systems:  No other skin or systemic complaints except as noted in HPI or Assessment and Plan.  Objective  Well appearing patient in no apparent distress; mood and affect are within normal limits.    A focused examination was performed of the following areas: Chest, shoulder  Relevant exam findings are noted in the Assessment and Plan.  left lateral shoulder Pink bx site left chest Pink bx site  Assessment & Plan     BASAL CELL CARCINOMA (BCC), UNSPECIFIED SITE left lateral shoulder Destruction of lesion Complexity: simple   Destruction method: electrodesiccation and curettage   Informed consent: discussed and consent obtained   Timeout:  patient name, date of birth, surgical site, and procedure verified Procedure prep:  Patient was prepped and draped in usual sterile fashion Prep type:  Isopropyl alcohol Anesthesia: the lesion was anesthetized in a standard fashion   Anesthetic:  1% lidocaine  w/ epinephrine  1-100,000 local infiltration Curettage performed in three different directions: Yes   Electrodesiccation performed over the curetted area: Yes   Curettage cycles:  3 Final wound size (cm):  1.3 Hemostasis achieved with:  aluminum chloride and electrodesiccation Outcome: patient tolerated procedure well with no complications   Post-procedure details: sterile dressing applied and wound care instructions given   Dressing type: bandage and petrolatum   SQUAMOUS CELL CARCINOMA IN SITU left chest Destruction of lesion Complexity: simple   Destruction method:  electrodesiccation and curettage   Informed consent: discussed and consent obtained   Timeout:  patient name, date of birth, surgical site, and procedure verified Procedure prep:  Patient was prepped and draped in usual sterile fashion Prep type:  Isopropyl alcohol Anesthesia: the lesion was anesthetized in a standard fashion   Anesthetic:  1% lidocaine  w/ epinephrine  1-100,000 local infiltration Curettage performed in three different directions: Yes   Electrodesiccation performed over the curetted area: Yes   Curettage cycles:  3 Final wound size (cm):  1.2 Hemostasis achieved with:  aluminum chloride and electrodesiccation Outcome: patient tolerated procedure well with no complications   Post-procedure details: sterile dressing applied and wound care instructions given   Dressing type: bandage and petrolatum    Return for TBSE, as scheduled, with Dr. Felipe Horton, HxBCC, HxSCCis, HxMM.  Kerstin Peeling, RMA, am acting as scribe for Harris Liming, MD .   Documentation: I have reviewed the above documentation for accuracy and completeness, and I agree with the above.  Harris Liming, MD

## 2023-09-20 ENCOUNTER — Ambulatory Visit: Admitting: Dermatology

## 2023-11-06 ENCOUNTER — Encounter: Payer: Self-pay | Admitting: Dermatology

## 2023-11-06 ENCOUNTER — Ambulatory Visit: Admitting: Dermatology

## 2023-11-06 DIAGNOSIS — L821 Other seborrheic keratosis: Secondary | ICD-10-CM

## 2023-11-06 DIAGNOSIS — Z1283 Encounter for screening for malignant neoplasm of skin: Secondary | ICD-10-CM | POA: Diagnosis not present

## 2023-11-06 DIAGNOSIS — D1801 Hemangioma of skin and subcutaneous tissue: Secondary | ICD-10-CM

## 2023-11-06 DIAGNOSIS — L578 Other skin changes due to chronic exposure to nonionizing radiation: Secondary | ICD-10-CM | POA: Diagnosis not present

## 2023-11-06 DIAGNOSIS — L814 Other melanin hyperpigmentation: Secondary | ICD-10-CM

## 2023-11-06 DIAGNOSIS — Z85820 Personal history of malignant melanoma of skin: Secondary | ICD-10-CM

## 2023-11-06 DIAGNOSIS — W908XXA Exposure to other nonionizing radiation, initial encounter: Secondary | ICD-10-CM

## 2023-11-06 DIAGNOSIS — D485 Neoplasm of uncertain behavior of skin: Secondary | ICD-10-CM

## 2023-11-06 DIAGNOSIS — D229 Melanocytic nevi, unspecified: Secondary | ICD-10-CM

## 2023-11-06 DIAGNOSIS — Z85828 Personal history of other malignant neoplasm of skin: Secondary | ICD-10-CM

## 2023-11-06 DIAGNOSIS — Z86007 Personal history of in-situ neoplasm of skin: Secondary | ICD-10-CM

## 2023-11-06 NOTE — Patient Instructions (Signed)

## 2023-11-06 NOTE — Progress Notes (Signed)
 Follow-Up Visit   Subjective  Shelley Thomas is a 83 y.o. female who presents for the following: Skin Cancer Screening and Full Body Skin Exam  The patient presents for Total-Body Skin Exam (TBSE) for skin cancer screening and mole check. The patient has spots, moles and lesions to be evaluated, some may be new or changing and the patient may have concern these could be cancer.  Hx MM, SCC, BCC.  The following portions of the chart were reviewed this encounter and updated as appropriate: medications, allergies, medical history  Review of Systems:  No other skin or systemic complaints except as noted in HPI or Assessment and Plan.  Objective  Well appearing patient in no apparent distress; mood and affect are within normal limits.  A full examination was performed including scalp, head, eyes, ears, nose, lips, neck, chest, axillae, abdomen, back, buttocks, bilateral upper extremities, bilateral lower extremities, hands, feet, fingers, toes, fingernails, and toenails. All findings within normal limits unless otherwise noted below.   Relevant physical exam findings are noted in the Assessment and Plan.    Assessment & Plan   SKIN CANCER SCREENING PERFORMED TODAY.  ACTINIC DAMAGE - Chronic condition, secondary to cumulative UV/sun exposure - diffuse scaly erythematous macules with underlying dyspigmentation - Recommend daily broad spectrum sunscreen SPF 30+ to sun-exposed areas, reapply every 2 hours as needed.  - Staying in the shade or wearing long sleeves, sun glasses (UVA+UVB protection) and wide brim hats (4-inch brim around the entire circumference of the hat) are also recommended for sun protection.  - Call for new or changing lesions.  LENTIGINES, SEBORRHEIC KERATOSES, HEMANGIOMAS - Benign normal skin lesions - Benign-appearing - Call for any changes  MELANOCYTIC NEVI - Tan-brown and/or pink-flesh-colored symmetric macules and papules - Benign appearing on exam  today - Observation - Call clinic for new or changing moles - Recommend daily use of broad spectrum spf 30+ sunscreen to sun-exposed areas.   HISTORY OF BASAL CELL CARCINOMA OF THE SKIN - left lateral shoulder, EDC 07/24/23 - No evidence of recurrence today - Recommend regular full body skin exams - Recommend daily broad spectrum sunscreen SPF 30+ to sun-exposed areas, reapply every 2 hours as needed.  - Call if any new or changing lesions are noted between office visits  HISTORY OF MELANOMA - right breast, Clark's Level IV, Breslow's 6 mm, excised 06/06/23, 06/27/23 negative sentinel node at [facility] - No evidence of recurrence today - No lymphadenopathy - Recommend regular full body skin exams - Recommend daily broad spectrum sunscreen SPF 30+ to sun-exposed areas, reapply every 2 hours as needed.  - Call if any new or changing lesions are noted between office visits - No unintentional weight loss, no HA - discussed that 06/27/23 Dr Tommas note mentions the plan for PET scan, recommend continuing follow up with [facility] oncology  HISTORY OF SQUAMOUS CELL CARCINOMA IN SITU OF THE SKIN - left chest, [facility] 07/24/23 - No evidence of recurrence today - Recommend regular full body skin exams - Recommend daily broad spectrum sunscreen SPF 30+ to sun-exposed areas, reapply every 2 hours as needed.  - Call if any new or changing lesions are noted between office visits     NEOPLASM OF UNCERTAIN BEHAVIOR OF SKIN right jawline Patient defers biopsy to r/o skin cancer L upper arm also has lesion that I would have biopsied LENTIGINES   MULTIPLE BENIGN NEVI   ACTINIC ELASTOSIS   SEBORRHEIC KERATOSES   CHERRY ANGIOMA   Return in  about 3 months (around 02/05/2024) for TBSE, with Dr. Claudene, HxMM, HxSCCis, HxBCC.   Documentation: I have reviewed the above documentation for accuracy and completeness, and I agree with the above.  Boneta Claudene, MD

## 2023-12-06 ENCOUNTER — Other Ambulatory Visit: Payer: Self-pay

## 2023-12-06 DIAGNOSIS — R911 Solitary pulmonary nodule: Secondary | ICD-10-CM

## 2023-12-06 DIAGNOSIS — C4352 Malignant melanoma of skin of breast: Secondary | ICD-10-CM

## 2023-12-06 DIAGNOSIS — R634 Abnormal weight loss: Secondary | ICD-10-CM

## 2023-12-07 ENCOUNTER — Ambulatory Visit
Admission: RE | Admit: 2023-12-07 | Discharge: 2023-12-07 | Disposition: A | Discharge status: Home / Self Care | Source: Ambulatory Visit

## 2023-12-07 DIAGNOSIS — C4352 Malignant melanoma of skin of breast: Secondary | ICD-10-CM | POA: Insufficient documentation

## 2023-12-07 DIAGNOSIS — R634 Abnormal weight loss: Secondary | ICD-10-CM | POA: Insufficient documentation

## 2023-12-07 DIAGNOSIS — R911 Solitary pulmonary nodule: Secondary | ICD-10-CM | POA: Diagnosis present

## 2023-12-07 MED ORDER — IOHEXOL 300 MG/ML  SOLN
100.0000 mL | Freq: Once | INTRAMUSCULAR | Status: AC | PRN
  Administered 2023-12-07: 80 mL via INTRAVENOUS

## 2023-12-11 ENCOUNTER — Other Ambulatory Visit: Payer: Self-pay

## 2023-12-11 DIAGNOSIS — R911 Solitary pulmonary nodule: Secondary | ICD-10-CM

## 2023-12-12 ENCOUNTER — Ambulatory Visit
Admission: RE | Admit: 2023-12-12 | Discharge: 2023-12-12 | Disposition: A | Discharge status: Home / Self Care | Source: Ambulatory Visit

## 2023-12-12 DIAGNOSIS — R911 Solitary pulmonary nodule: Secondary | ICD-10-CM | POA: Diagnosis present

## 2023-12-12 MED ORDER — IOHEXOL 300 MG/ML  SOLN
60.0000 mL | Freq: Once | INTRAMUSCULAR | Status: AC | PRN
  Administered 2023-12-12: 60 mL via INTRAVENOUS

## 2024-01-06 ENCOUNTER — Ambulatory Visit
Admission: EM | Admit: 2024-01-06 | Discharge: 2024-01-06 | Disposition: A | Discharge status: Home / Self Care | Attending: Nurse Practitioner | Admitting: Nurse Practitioner

## 2024-01-06 ENCOUNTER — Encounter: Payer: Self-pay | Admitting: Emergency Medicine

## 2024-01-06 DIAGNOSIS — S80812A Abrasion, left lower leg, initial encounter: Secondary | ICD-10-CM | POA: Diagnosis not present

## 2024-01-06 DIAGNOSIS — W548XXA Other contact with dog, initial encounter: Secondary | ICD-10-CM | POA: Diagnosis not present

## 2024-01-06 DIAGNOSIS — Z23 Encounter for immunization: Secondary | ICD-10-CM | POA: Diagnosis not present

## 2024-01-06 MED ORDER — TETANUS-DIPHTH-ACELL PERTUSSIS 5-2-15.5 LF-MCG/0.5 IM SUSP
0.5000 mL | Freq: Once | INTRAMUSCULAR | Status: AC
  Administered 2024-01-06: 0.5 mL via INTRAMUSCULAR

## 2024-01-06 MED ORDER — CEPHALEXIN 500 MG PO CAPS: 500.0000 mg | ORAL_CAPSULE | Freq: Four times a day (QID) | ORAL | 0 refills | Status: AC

## 2024-01-06 NOTE — ED Triage Notes (Signed)
 Patient states that her dog scratched her left lower leg on Friday.  Patient reports some redness and tenderness at the site.  Patient has kept it clean and putting antibiotic ointment on the area.  Patient denies fevers.

## 2024-01-06 NOTE — ED Provider Notes (Signed)
 [facility]    CSN: [id number] Arrival date & time: 01/06/24  1110      History   Chief Complaint Chief Complaint  Patient presents with   Abrasion    Skin Tear to left lower leg    HPI Shelley Thomas is a 83 y.o. female.   Patient presents today for dog scratch to left leg.  Reports Friday, her dog jumped on her and scratched her leg on accident.  No fever or nausea/vomiting.  She cleaned the area with peroxide and has been applying antibiotic ointment over the area and is concerned for infection today.  No significant swelling or pain around the dog scratch.  Last tetanus shot unknown.  Patient acknowledges elevated blood pressure today.  Reports she has not taken her blood pressure medicine yet today.    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   left chest, [facility] 07/24/23    Patient Active Problem List   Diagnosis Date Noted   CKD (chronic kidney disease), stage III (HCC) 01/26/2023   Sepsis due to pneumonia (HCC) 12/27/2021   Acute on chronic respiratory failure with hypoxia (HCC) 12/27/2021   Left renal stone 8 mm 12/27/2021   Aortic atherosclerosis 12/27/2021   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   CAP (community acquired pneumonia) 12/26/2021   COPD with acute exacerbation (HCC) 12/26/2021   AKI (acute kidney injury) 12/26/2021   Hypertension 12/26/2021   High cholesterol 12/26/2021   Personal history of tobacco use, presenting hazards to health 06/20/2016    Past Surgical History:  Procedure Laterality Date   ABDOMINAL HYSTERECTOMY     CATARACT EXTRACTION W/PHACO Left 09/27/2022   Procedure: CATARACT EXTRACTION PHACO AND INTRAOCULAR  LENS PLACEMENT (IOC) LEFT 3.90 00:33.1;  Surgeon: Mittie Gaskin, MD;  Location: [facility];  Service: Ophthalmology;  Laterality: Left;   CATARACT EXTRACTION W/PHACO Right 10/11/2022   Procedure: CATARACT EXTRACTION PHACO AND INTRAOCULAR LENS PLACEMENT (IOC) RIGHT 8.25 00:45.1;  Surgeon: Mittie Gaskin, MD;  Location: [facility];  Service: Ophthalmology;  Laterality: Right;   TUMOR REMOVAL      OB History   No obstetric history on file.      Home Medications    Prior to Admission medications   Medication Sig Start Date End Date Taking? Authorizing Provider  cephALEXin  (KEFLEX ) 500 MG capsule Take 1 capsule (500 mg total) by mouth 4 (four) times daily for 5 days. 01/06/24 01/11/24 Yes Chandra Harlene LABOR, NP  albuterol  (VENTOLIN  HFA) 108 (90 Base) MCG/ACT inhaler Inhale 2 puffs into the lungs every 6 (six) hours as needed for wheezing or shortness of breath. 09/16/22   Bradler, Evan K, MD  budesonide -formoterol  (SYMBICORT ) 80-4.5 MCG/ACT inhaler Inhale 2 puffs into the lungs daily. Patient not taking: Reported on 09/27/2022 09/16/22   Bradler, Evan K, MD  fenofibrate  (TRICOR ) 145 MG tablet Take 145 mg by mouth daily.    [provider]  hydrochlorothiazide  (HYDRODIURIL ) 12.5 MG tablet Take 12.5 mg by mouth every morning. 11/27/22   [provider]  lisinopril  (PRINIVIL ,ZESTRIL ) 20 MG tablet Take 20 mg by mouth 2 (two) times daily. 11/07/17   [provider]  meclizine (ANTIVERT) 25 MG tablet Take 25 mg by mouth every 8 (eight) hours as needed. 12/07/22   [provider]  meloxicam (MOBIC) 15 MG tablet Take 15 mg by mouth daily. 01/02/23   [provider]  mupirocin  ointment (BACTROBAN ) 2 % APPLY TO AFFECTED AREA TOPICALLY DAILY 06/11/23   Claudene Lehmann, MD  PREMARIN 0.3 MG tablet Take 1-2 tablets by mouth once a week. 01/05/23   [provider]    Family History History reviewed. No pertinent family  history.  Social History Social History   Tobacco Use   Smoking status: Every Day    Current packs/day: 0.20    Average packs/day: 0.2 packs/day for 50.0 years (10.0 ttl pk-yrs)    Types: Cigarettes   Smokeless tobacco: Never  Vaping Use   Vaping status: Never Used  Substance Use Topics   Alcohol use: No   Drug use: No     Allergies   Alendronate   Review of Systems Review of Systems Per HPI  Physical Exam Triage Vital Signs ED Triage Vitals  Encounter Vitals Group     BP 01/06/24 1204 (!) 152/111     Girls Systolic BP Percentile --      Girls Diastolic BP Percentile --      Boys Systolic BP Percentile --      Boys Diastolic BP Percentile --      Pulse Rate 01/06/24 1203 87     Resp 01/06/24 1203 14     Temp 01/06/24 1203 98.2 F (36.8 C)     Temp Source 01/06/24 1203 Oral     SpO2 01/06/24 1203 97 %     Weight 01/06/24 1200 106 lb 14.8 oz (48.5 kg)     Height 01/06/24 1200 5' 2 (1.575 m)     Head Circumference --      Peak Flow --      Pain Score 01/06/24 1200 0     Pain Loc --      Pain Education --      Exclude from Growth Chart --    No data found.  Updated Vital Signs BP (!) 152/111 (BP Location: Left Arm) Comment: Patient did not take BP medicine today  Pulse 87   Temp 98.2 F (36.8 C) (Oral)   Resp 14   Ht 5' 2 (1.575 m)   Wt 106 lb 14.8 oz (48.5 kg)   SpO2 97%   BMI 19.56 kg/m   Visual Acuity Right Eye Distance:   Left Eye Distance:   Bilateral Distance:    Right Eye Near:   Left Eye Near:    Bilateral Near:     Physical Exam Vitals and nursing note reviewed.  Constitutional:      General: She is not in acute distress.    Appearance: Normal appearance. She is not toxic-appearing.  HENT:     Mouth/Throat:     Mouth: Mucous membranes are moist.     Pharynx: Oropharynx is clear.  Pulmonary:     Effort: Pulmonary effort is normal. No respiratory distress.  Skin:    General: Skin is warm and dry.     Capillary Refill:  Capillary refill takes less than 2 seconds.     Comments: Approximately 2.5 cm linear abrasion to left lower extremity.  No active drainage, surrounding erythema, tenderness to touch, odor, fluctuance.  Neurological:     Mental Status: She is alert and oriented to person, place, and time.  Psychiatric:  Behavior: Behavior is cooperative.      UC Treatments / Results  Labs (all labs ordered are listed, but only abnormal results are displayed) Labs Reviewed - No data to display  EKG   Radiology No results found.  Procedures Procedures (including critical care time)  Medications Ordered in UC Medications  Tdap (ADACEL) injection 0.5 mL (0.5 mLs Intramuscular Given 01/06/24 1211)    Initial Impression / Assessment and Plan / UC Course  I have reviewed the triage vital signs and the nursing notes.  Pertinent labs & imaging results that were available during my care of the patient were reviewed by me and considered in my medical decision making (see chart for details).   Patient is a very pleasant 83 year old female presenting today for dog scratch/abrasion of left lower extremity.  Vital signs are stable (hypertension likely due to with holding blood pressure medication today) and patient is well-appearing.  On exam, there is a linear abrasion to the left lower extremity without surrounding signs of deep infection.  Given mechanism of injury, will cover with Keflex  500 mg 4 times daily for 5 days.  Creatinine clearance 37 today with most recent creatinine 0.9 earlier this year; no dosage adjustment needed.  Wound care discussed.  ER and return precautions also discussed.  The patient was given the opportunity to ask questions.  All questions answered to their satisfaction.  The patient is in agreement to this plan.   Final Clinical Impressions(s) / UC Diagnoses   Final diagnoses:  Abrasion of left lower extremity, initial encounter  Dog scratch     Discharge Instructions       Take the Keflex  four times daily (every 6 hours) for 5 days to treat for infection in the scratch on your skin.  We have updated your tetanus shot today.  Clean the area twice daily with soap and water and continue to apply antibiotic ointment over the wound.  Seek care if symptoms worsen despite treatment.    ED Prescriptions     Medication Sig Dispense Auth. Provider   cephALEXin  (KEFLEX ) 500 MG capsule Take 1 capsule (500 mg total) by mouth 4 (four) times daily for 5 days. 20 capsule Chandra Harlene LABOR, NP      PDMP not reviewed this encounter.   Chandra Harlene LABOR, NP 01/06/24 [phone]

## 2024-01-06 NOTE — Discharge Instructions (Signed)
 Take the Keflex  four times daily (every 6 hours) for 5 days to treat for infection in the scratch on your skin.  We have updated your tetanus shot today.  Clean the area twice daily with soap and water and continue to apply antibiotic ointment over the wound.  Seek care if symptoms worsen despite treatment.

## 2024-02-05 ENCOUNTER — Ambulatory Visit: Admitting: Dermatology
